# Patient Record
Sex: Female | Born: 1970
Health system: Southern US, Community
[De-identification: ages and names within clinical notes are randomized; demographics above are authoritative.]

## PROBLEM LIST (undated history)

## (undated) DIAGNOSIS — G4730 Sleep apnea, unspecified: Secondary | ICD-10-CM

## (undated) DIAGNOSIS — M549 Dorsalgia, unspecified: Secondary | ICD-10-CM

## (undated) DIAGNOSIS — R600 Localized edema: Secondary | ICD-10-CM

## (undated) DIAGNOSIS — R112 Nausea with vomiting, unspecified: Secondary | ICD-10-CM

## (undated) DIAGNOSIS — J45909 Unspecified asthma, uncomplicated: Secondary | ICD-10-CM

## (undated) DIAGNOSIS — J309 Allergic rhinitis, unspecified: Secondary | ICD-10-CM

## (undated) DIAGNOSIS — Z1379 Encounter for other screening for genetic and chromosomal anomalies: Secondary | ICD-10-CM

## (undated) DIAGNOSIS — K5900 Constipation, unspecified: Secondary | ICD-10-CM

## (undated) DIAGNOSIS — Z9889 Other specified postprocedural states: Secondary | ICD-10-CM

## (undated) DIAGNOSIS — Z87442 Personal history of urinary calculi: Secondary | ICD-10-CM

## (undated) DIAGNOSIS — Z5189 Encounter for other specified aftercare: Secondary | ICD-10-CM

## (undated) DIAGNOSIS — Z803 Family history of malignant neoplasm of breast: Secondary | ICD-10-CM

## (undated) DIAGNOSIS — I639 Cerebral infarction, unspecified: Secondary | ICD-10-CM

## (undated) DIAGNOSIS — D649 Anemia, unspecified: Secondary | ICD-10-CM

## (undated) DIAGNOSIS — I10 Essential (primary) hypertension: Secondary | ICD-10-CM

## (undated) DIAGNOSIS — C801 Malignant (primary) neoplasm, unspecified: Secondary | ICD-10-CM

## (undated) DIAGNOSIS — K219 Gastro-esophageal reflux disease without esophagitis: Secondary | ICD-10-CM

## (undated) DIAGNOSIS — N809 Endometriosis, unspecified: Secondary | ICD-10-CM

## (undated) DIAGNOSIS — N289 Disorder of kidney and ureter, unspecified: Secondary | ICD-10-CM

## (undated) DIAGNOSIS — Z85820 Personal history of malignant melanoma of skin: Secondary | ICD-10-CM

## (undated) DIAGNOSIS — N3010 Interstitial cystitis (chronic) without hematuria: Secondary | ICD-10-CM

## (undated) DIAGNOSIS — Z800 Family history of malignant neoplasm of digestive organs: Secondary | ICD-10-CM

## (undated) HISTORY — PX: COLON RESECTION: SHX5231

## (undated) HISTORY — DX: Family history of malignant neoplasm of digestive organs: Z80.0

## (undated) HISTORY — PX: MELANOMA EXCISION: SHX5266

## (undated) HISTORY — DX: Personal history of urinary calculi: Z87.442

## (undated) HISTORY — PX: ABDOMINAL HYSTERECTOMY: SHX81

## (undated) HISTORY — DX: Family history of malignant neoplasm of breast: Z80.3

## (undated) HISTORY — DX: Cerebral infarction, unspecified: I63.9

## (undated) HISTORY — DX: Constipation, unspecified: K59.00

## (undated) HISTORY — DX: Localized edema: R60.0

## (undated) HISTORY — PX: COLON SURGERY: SHX602

## (undated) HISTORY — DX: Dorsalgia, unspecified: M54.9

## (undated) HISTORY — DX: Anemia, unspecified: D64.9

## (undated) HISTORY — PX: APPENDECTOMY: SHX54

## (undated) HISTORY — DX: Disorder of kidney and ureter, unspecified: N28.9

## (undated) HISTORY — DX: Sleep apnea, unspecified: G47.30

## (undated) HISTORY — DX: Personal history of malignant melanoma of skin: Z85.820

## (undated) HISTORY — DX: Interstitial cystitis (chronic) without hematuria: N30.10

## (undated) HISTORY — DX: Unspecified asthma, uncomplicated: J45.909

## (undated) HISTORY — PX: PELVIC LAPAROSCOPY: SHX162

## (undated) HISTORY — DX: Encounter for other screening for genetic and chromosomal anomalies: Z13.79

## (undated) HISTORY — DX: Encounter for other specified aftercare: Z51.89

## (undated) HISTORY — PX: ARTHROSCOPIC REPAIR ACL: SUR80

---

## 1999-12-31 ENCOUNTER — Ambulatory Visit (HOSPITAL_COMMUNITY): Admission: RE | Admit: 1999-12-31 | Discharge: 1999-12-31 | Payer: Self-pay | Admitting: Obstetrics and Gynecology

## 1999-12-31 ENCOUNTER — Encounter: Payer: Self-pay | Admitting: Obstetrics and Gynecology

## 2000-02-27 ENCOUNTER — Encounter: Payer: Self-pay | Admitting: Obstetrics and Gynecology

## 2000-02-27 ENCOUNTER — Inpatient Hospital Stay (HOSPITAL_COMMUNITY): Admission: AD | Admit: 2000-02-27 | Discharge: 2000-02-27 | Payer: Self-pay | Admitting: Pediatrics

## 2000-06-04 ENCOUNTER — Encounter: Payer: Self-pay | Admitting: Obstetrics and Gynecology

## 2000-06-04 ENCOUNTER — Inpatient Hospital Stay (HOSPITAL_COMMUNITY): Admission: AD | Admit: 2000-06-04 | Discharge: 2000-06-04 | Payer: Self-pay | Admitting: Obstetrics and Gynecology

## 2000-06-06 ENCOUNTER — Inpatient Hospital Stay (HOSPITAL_COMMUNITY): Admission: AD | Admit: 2000-06-06 | Discharge: 2000-06-10 | Payer: Self-pay | Admitting: Obstetrics and Gynecology

## 2000-07-17 ENCOUNTER — Other Ambulatory Visit: Admission: RE | Admit: 2000-07-17 | Discharge: 2000-07-17 | Payer: Self-pay | Admitting: Obstetrics and Gynecology

## 2000-12-31 ENCOUNTER — Encounter: Payer: Self-pay | Admitting: Emergency Medicine

## 2000-12-31 ENCOUNTER — Emergency Department (HOSPITAL_COMMUNITY): Admission: EM | Admit: 2000-12-31 | Discharge: 2001-01-01 | Payer: Self-pay | Admitting: Emergency Medicine

## 2001-01-01 ENCOUNTER — Inpatient Hospital Stay (HOSPITAL_COMMUNITY): Admission: EM | Admit: 2001-01-01 | Discharge: 2001-01-04 | Payer: Self-pay | Admitting: Emergency Medicine

## 2001-01-03 ENCOUNTER — Encounter: Payer: Self-pay | Admitting: Urology

## 2001-08-09 ENCOUNTER — Other Ambulatory Visit: Admission: RE | Admit: 2001-08-09 | Discharge: 2001-08-09 | Payer: Self-pay | Admitting: Obstetrics and Gynecology

## 2002-03-03 ENCOUNTER — Encounter: Payer: Self-pay | Admitting: Urology

## 2002-03-03 ENCOUNTER — Ambulatory Visit (HOSPITAL_BASED_OUTPATIENT_CLINIC_OR_DEPARTMENT_OTHER): Admission: RE | Admit: 2002-03-03 | Discharge: 2002-03-03 | Payer: Self-pay | Admitting: Urology

## 2002-09-01 ENCOUNTER — Other Ambulatory Visit: Admission: RE | Admit: 2002-09-01 | Discharge: 2002-09-01 | Payer: Self-pay | Admitting: Obstetrics and Gynecology

## 2003-09-29 ENCOUNTER — Encounter: Admission: RE | Admit: 2003-09-29 | Discharge: 2003-09-29 | Payer: Self-pay | Admitting: Obstetrics and Gynecology

## 2004-02-09 ENCOUNTER — Encounter (INDEPENDENT_AMBULATORY_CARE_PROVIDER_SITE_OTHER): Payer: Self-pay | Admitting: Plastic Surgery

## 2004-02-09 ENCOUNTER — Ambulatory Visit (HOSPITAL_BASED_OUTPATIENT_CLINIC_OR_DEPARTMENT_OTHER): Admission: RE | Admit: 2004-02-09 | Discharge: 2004-02-09 | Payer: Self-pay | Admitting: Plastic Surgery

## 2004-02-09 ENCOUNTER — Ambulatory Visit (HOSPITAL_COMMUNITY): Admission: RE | Admit: 2004-02-09 | Discharge: 2004-02-09 | Payer: Self-pay | Admitting: Plastic Surgery

## 2004-07-17 ENCOUNTER — Ambulatory Visit: Payer: Self-pay | Admitting: Pulmonary Disease

## 2004-09-26 ENCOUNTER — Other Ambulatory Visit: Admission: RE | Admit: 2004-09-26 | Discharge: 2004-09-26 | Payer: Self-pay | Admitting: Obstetrics and Gynecology

## 2006-03-09 ENCOUNTER — Ambulatory Visit: Payer: Self-pay | Admitting: Critical Care Medicine

## 2006-10-16 ENCOUNTER — Ambulatory Visit: Payer: Self-pay | Admitting: Pulmonary Disease

## 2006-10-16 LAB — CONVERTED CEMR LAB
Basophils Relative: 0.8 % (ref 0.0–1.0)
Eosinophils Absolute: 0.3 10*3/uL (ref 0.0–0.6)
Eosinophils Relative: 3.7 % (ref 0.0–5.0)
Lymphocytes Relative: 30.9 % (ref 12.0–46.0)
MCV: 89.5 fL (ref 78.0–100.0)
Monocytes Relative: 5.3 % (ref 3.0–11.0)
Neutro Abs: 4.4 10*3/uL (ref 1.4–7.7)
Platelets: 400 10*3/uL (ref 150–400)
RBC: 4.4 M/uL (ref 3.87–5.11)
TSH: 0.92 microintl units/mL (ref 0.35–5.50)
WBC: 7.5 10*3/uL (ref 4.5–10.5)

## 2007-09-17 ENCOUNTER — Encounter: Payer: Self-pay | Admitting: Adult Health

## 2007-12-10 ENCOUNTER — Encounter: Admission: RE | Admit: 2007-12-10 | Discharge: 2007-12-10 | Payer: Self-pay | Admitting: Obstetrics and Gynecology

## 2008-03-23 ENCOUNTER — Telehealth (INDEPENDENT_AMBULATORY_CARE_PROVIDER_SITE_OTHER): Payer: Self-pay | Admitting: *Deleted

## 2008-10-05 ENCOUNTER — Encounter: Admission: RE | Admit: 2008-10-05 | Discharge: 2008-10-05 | Payer: Self-pay | Admitting: Otolaryngology

## 2008-12-08 ENCOUNTER — Encounter: Payer: Self-pay | Admitting: Pulmonary Disease

## 2008-12-13 ENCOUNTER — Encounter: Admission: RE | Admit: 2008-12-13 | Discharge: 2008-12-13 | Payer: Self-pay | Admitting: Family Medicine

## 2008-12-20 ENCOUNTER — Encounter: Admission: RE | Admit: 2008-12-20 | Discharge: 2008-12-20 | Payer: Self-pay | Admitting: Obstetrics and Gynecology

## 2008-12-26 ENCOUNTER — Encounter: Payer: Self-pay | Admitting: Pulmonary Disease

## 2009-01-29 ENCOUNTER — Encounter: Payer: Self-pay | Admitting: Pulmonary Disease

## 2009-04-10 ENCOUNTER — Encounter: Payer: Self-pay | Admitting: Pulmonary Disease

## 2010-06-11 ENCOUNTER — Encounter
Admission: RE | Admit: 2010-06-11 | Discharge: 2010-06-11 | Payer: Self-pay | Source: Home / Self Care | Attending: Family Medicine | Admitting: Family Medicine

## 2010-08-27 ENCOUNTER — Other Ambulatory Visit: Payer: Self-pay | Admitting: Dermatology

## 2010-09-23 ENCOUNTER — Other Ambulatory Visit: Payer: Self-pay | Admitting: Dermatology

## 2010-10-15 NOTE — Assessment & Plan Note (Signed)
[facility]                             PULMONARY OFFICE NOTE   Samantha Castro, Samantha Castro                       MRN:          [id number]  DATE:10/16/2006                            DOB:          Jan 07, 1971    HISTORY OF PRESENT ILLNESS:  The patient is a 40 year old white female  patient of Dr. 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  The patient presents today reporting that she is in her  general state of good health.  She denies any chest pain, shortness of  breath, abdominal pain, bloody stools, or leg swelling.  The patient  reports that she continues to be under quite a bit of stress.  Her  father has lung cancer and her mother probably has early stages of  dementia.  The patient also has a 29-year-old child and is working part-  time and going to school part-time.  The patient does follow up with Dr.  Jackelyn Knife for her gynecological care and reports no recent changes.  The  patient did have recent lab work through a life [employer] technologist.  Her  total cholesterol was 198, LDL was at 123.  Blood sugar was at 87.   PAST MEDICAL HISTORY:  1. Allergic rhinitis.  2. Pre-eclampsia.  3. Mild exercise induced asthma.  4. Skin melanoma followed by Dr. Yetta Barre in Dermatology.   CURRENT MEDICATIONS:  Correct and reviewed on the front of chart.   PHYSICAL EXAMINATION:  The patient is a very pleasant female in no acute  distress.  She is afebrile with stable vital signs.  Weight is at 156.  HEENT:  Unremarkable.  NECK:  Supple without cervical adenopathy.  No JVD.  LUNGS:  Sounds are clear to auscultation bilaterally.  CARDIAC:  S1-S2 without murmur, rub, or gallop.  ABDOMEN:  Soft and nontender, no palpable or hepatosplenomegaly.  EXTREMITIES:  Warm without any calf tenderness, cyanosis, clubbing, or  edema.  SKIN:  Warm without any rash.   IMPRESSION/PLAN:  1. Allergic rhinitis, well compensated on Zyrtec.  Patient has      requested change from Nasacort to [facility] due to formulary      coverage.  She is asked to use Flonase 1-2 puffs twice daily.  2. Health maintenance activities.  The patient is up to date for her      tetanus.  She is to continue to follow up with Dr. Jackelyn Knife for      her gynecological care.  Is recommended on monthly self breast      exam.  The patient is advised on exercise and dietary measures as      well and use of sunscreen.  3. Nursing school physical form.  Was completed and immunization data      was updated.  A PPD test was placed today for nursing school      requirements.  We will follow up accordingly.      Rubye Oaks, NP  Electronically Signed  Samantha Castro. Samantha Basque, MD  Electronically Signed   TP/MedQ  DD: 10/16/2006  DT: 10/16/2006  Job #: [id number]

## 2010-10-18 NOTE — Discharge Summary (Signed)
[facility]  Patient:    Samantha Castro, Samantha Castro Visit Number: [id number] MRN: [id number]          Service Type: MED Location: [facility] Left 01 Attending Physician:  Evlyn Clines Dictated by:   Barron Alvine, M.D. Adm. Date:  01/01/2001 Disc. Date: 01/04/2001                             Discharge Summary  DISCHARGE DIAGNOSES: 1. Ureteral calculus. 2. Hydronephrosis. 3. Acute pyelonephritis/urosepsis.  PROCEDURE:  Cystoscopy, retrograde pyelogram, ureteroscopy, and double J stent placement on 01/01/01.  HOSPITAL COURSE:  Samantha Castro is a patient of Dr. Kerin Ransom.  She had presented with left flank pain, severe nausea and vomiting, and urgency. Under evaluation, they found that the patient had evidence of a distal ureteral calculus with obstruction of the ureter.  Dr. Annabell Howells had planned on placing a double J stent because of what appeared to be obstruction along with concurrent infection.  Because the case got delayed and I was on call, he asked me if I would intervene on behalf of Samantha Castro to try to relieve her obstruction and stone.  The patient was admitted and noted to have a significantly elevated white blood cell count.  At surgery, she was found to have a small distal ureteral calculus which was easily extracted without significant manipulation.  A double J stent was placed.  The patients postoperative white blood cell count was 29,000.  She continued to have flank pain and fever and chills for several days after her system was decompressed. Her urine culture did eventually grow E. coli which was pansensitive.  Her clinical situation slowly improved.  Dr. Annabell Howells took over her care, and discharged her home on postoperative day #3.  She was afebrile at the time of discharge, and feeling significantly better.  She was sent home on p.o. Bactrim.  DISPOSITION:  The patient will be seen by Dr. Annabell Howells in followup. Dictated by:   Barron Alvine, M.D. Attending  Physician:  Evlyn Clines DD:  01/27/01 TD:  01/27/01 Job: [id number]

## 2010-10-18 NOTE — Assessment & Plan Note (Signed)
[facility]                               PULMONARY OFFICE NOTE   KEALA, DRUM                       MRN:          [id number]  DATE:03/09/2006                            DOB:          19-Sep-1970   HISTORY OF PRESENT ILLNESS:  This is a very pleasant 40 year old white  female patient of Dr. Jodelle Green who has a known history of allergic rhinitis  who presents for routine follow up.  The patient is maintained on Zyrtec D  and Nasacort AQ nasal spray.  The patient reports she has been doing  exceptionally well without any significant complaints.  She denies any chest  pain, shortness of breath, wheezing, nocturnal symptoms, post nasal drip, or  congestion.  The patient does request a refill of her Xanax.  She reports  that she has had some slight stress recently with her father who has been  recently diagnosed with lung cancer.  She has had a previous prescription  for Xanax but only uses it on a rare occasion and her prescription is  expired.   PAST MEDICAL HISTORY:  Allergic rhinitis. Pre-eclampsia.  Mild exercise  induced asthma.   CURRENT MEDICATIONS:  Zyrtec D b.i.d., hydrochlorothiazide 25 mg daily,  Nasacort AQ 2 puffs daily, Xanax 0.25 mg, 1/2 to 1 three times a day as  needed.   ALLERGIES:  No known drug allergies.   PHYSICAL EXAMINATION:  GENERAL:  The patient is a pleasant female in no acute distress.  VITAL SIGNS:  She is afebrile with stable vital signs.  HEENT:  Unremarkable.  Nasal mucosa is pink and moist.  Posterior pharynx is  clear.  Tympanic membranes are normal.  NECK:  Supple without adenopathy.  LUNGS:  Sounds are clear.  CARDIAC:  Regular rate.  ABDOMEN:  Soft and benign.  EXTREMITIES:  Warm without any edema.   IMPRESSION AND PLAN:  1. Allergic rhinitis is well compensated with Zyrtec and Nasacort.  The      patient is to continue with her current regimen.  2. Mild anxiety.  The patient's prescription was refilled  on Xanax 0.25      mg, 1/2 to 1 up to three times a day as needed, #30 was given without      any refills.  3. The patient will follow up back up with Dr. Kriste Basque for her complete      physical exam or sooner if needed.    ______________________________  Rubye Oaks, NP    ______________________________  Lonzo Cloud. Kriste Basque, MD   TP/MedQ DD:  03/11/2006 DT:  03/12/2006 Job #:  [id number]

## 2010-10-18 NOTE — Op Note (Signed)
NAME:  Samantha Castro, Samantha Castro                          ACCOUNT NO.:  [id number]   MEDICAL RECORD NO.:  [id number]                   PATIENT TYPE:  AMB   LOCATION:  DSC                                  FACILITY:  [facility]   PHYSICIAN:  Alfredia Ferguson, M.D.               DATE OF BIRTH:  Oct 04, 1970   DATE OF PROCEDURE:  02/09/2004  DATE OF DISCHARGE:                                 OPERATIVE REPORT   PREOPERATIVE DIAGNOSIS:  1.  A 9 mm pigmented nevus, left midcalf.  2.  An 8 mm pigmented nevus, right breast contained within the areola.   POSTOPERATIVE DIAGNOSIS:  1.  A 9 mm pigmented nevus, left midcalf.  2.  An 8 mm pigmented nevus, right breast contained within the areola.   OPERATION PERFORMED:  Elliptical excision of pigmented nevi times two.   SURGEON:  Alfredia Ferguson, M.D.   ANESTHESIA:  2% Xylocaine with 1:100,000 epinephrine.   INDICATIONS FOR PROCEDURE:  A 40 year old woman with multivarigated  pigmented nevis which are in the 8 to 9 mm size range.  These have been  changing of late in terms of darkening of the color and enlarging.  The  patient and Dr. Swaziland wish to have these areas removed.  The patient  understands she will be trading what she has for permanent and potentially  unsightly scar.  In spite of that, the patient wishes to proceed with the  surgery.   DESCRIPTION OF PROCEDURE:  Skin markers were placed around each of the two  lesions in elliptical fashion with approximately 2 mm margins.  Local  anesthesia was infiltrated in both areas.  The patient was first placed in a  prone position.  Her left calf was prepped with Betadine and draped with  sterile drapes.  An elliptical excision of the lesion down to the level of  the subcutaneous tissue was carried out.  Specimen was passed off for  pathology.  The wound edges were undermined for a distance of several  millimeters in all directions.  Hemostasis was accomplished using pressure.  The wound was closed by  approximating the dermis with interrupted 4-0  Monocryl suture.  The skin was united with a running 4-0 nylon.  The patient  tolerated the procedure well with minimal blood loss.  The area was  cleansed, dried and a circumferential dressing was placed on the calf.  The  patient was returned to the supine position where her right breast was  prepped with Betadine and draped in sterile drapes.  A radial excision in  the areola was carried out excising the pigmented nevus.  Within the area of  excision was a second pigmented nevus just adjacent to the first one.  I  incorporated both nevi in the excision.  The excision was down to the  subcutaneous and some of the breast tissue.  The specimen was passed off for  pathology.  Wound edges were undermined for a  distance of several millimeters in all directions.  The wound was closed by  approximating the dermis with interrupted 4-0 Monocryl suture.  The skin was  closed with a running 6-0 nylon suture.  The patient tolerated the procedure  well.  A  light dressing was applied and the patient was discharged to home  in satisfactory condition.                                               Alfredia Ferguson, M.D.    WBB/MEDQ  D:  02/09/2004  T:  02/09/2004  Job:  [id number]   cc:   Amy Y. Swaziland, M.D.  [address]  [place]  [place] [address]  Fax: [phone]

## 2010-10-18 NOTE — Discharge Summary (Signed)
[facility]  Patient:    Samantha Castro, Samantha Castro                       MRN: [id number] Adm. Date:  [id number] Disc. Date: [id number] Attending:  Malon Kindle                           Discharge Summary  HISTORY OF PRESENT ILLNESS:   [age]-year-old white married female para 0, gravida 1, last period August 26, 1999, [facility] June 02, 2000 by dates and June 06, 2000 by ultrasound admitted with contractions, bloody show, and an elevated blood pressure.  Blood group and type O positive with negative antibody, nonreactive serology, rubella immune, hepatitis B surface antigen negative, GC and Chlamydia negative, HIV negative, triple screen normal, one-hour glucola 118, group B strep negative.  Vaginal ultrasound on Oct 31, 1999 crown-rump length 2.2 cm, 9 weeks 0 days, [facility] June 06, 2000.  Repeat ultrasound December 31, 1999 average gestational age [redacted] weeks 6 days, [facility] June 03, 2000.  The patient had second trimester bleeding.  Ultrasound on February 27, 2000 average gestational age [redacted] weeks 0 days, [facility] May 28, 2000.  Fetal fibronectin was negative. Prenatal course has been relatively unremarkable except the patient has had somewhat more visits than the average patient.  The patient called early on the morning of admission complaining of bloody show, painful contractions, and decreased fetal movement.  The cervix had been 3+ cm in the office on June 04, 2000.  She has had no headache or epigastric pain.  PAST MEDICAL HISTORY:         No known allergies.  Operations: None. Illnesses: Exercise-induced asthma, gastroesophageal reflux disorder.  FAMILY HISTORY:               Father with high blood pressure.  Mother had a CVA secondary to carotid stenosis.  SOCIAL HISTORY:               Alcohol, tobacco, and drugs none.  PHYSICAL EXAMINATION:         On admission, the patients blood pressure was 146/102 and 161/105, temperature was normal, pulse  was 105, respirations 18. The abdomen was soft.  There was term-sized fundus.  Fetal heart tones were normal.  Cervix was 3+ cm, 50%, vertex, and at -3.  Deep tendon reflexes were 2+.  IMPRESSIONS:                  1. Intrauterine pregnancy at term.                               2. Pregnancy-induced high blood pressure.  HOSPITAL COURSE:              PIH labs were obtained.  Magnesium sulfate was begun at 4 g bolus and then 2 g/hr.  By 9:18 a.m., patient had no contractions.  Her labs were okay.  Pitocin was begun at 9 a.m.  The cervix was 3+ cm, 50%, vertex, and at -3.  At that time, I felt the vertex was too high for an amniotomy.  By 12:15 p.m., the contractions were every two to three minutes, cervix 3+ cm, 70%, vertex, -2.  Artificial rupture of the membranes produced clear fluid.  Blood pressure at that time was mostly in the 80s diastolic.  Pitocin was at 12 mU/min.  By 6:53 p.m., the contractions were regular for the most part, cervix was 7 cm, 90%, vertex, and at -2.  By 9:45 p.m., the contractions were regular on 22 mU/min of Pitocin, cervix was 8-9 cm, 100%, vertex, and at -1.  There was blood in the urine.  At that point, the patient had a catheter because of her epidural.  The patient made slow but steady progress to full dilatation.  She pushed well and delivered spontaneously LOA over an intact perineum but a left vaginal and labial laceration a living female infant 7 pounds 7 ounces, Apgars were 6 at one and 8 at five minutes.  At five minutes, one was off for color and one was off for tone, this was thought to be secondary to the magnesium sulfate.  Dr. Ambrose Mantle was in attendance.  Placenta was intact.  Uterus normal.  Laceration repaired with 3-0 Dexon.  Blood loss about 500 cc.  The patient had hematuria for at least seven hours before delivery so I left the Foley catheter in.  On the day of delivery at 4:22 p.m., the blood pressure was okay, output was good, hematuria had  cleared, magnesium level on 2 g/hr was 6.8 so I decreased the magnesium sulfate to 1.5 g/hr and the patient felt better.  She had less dizziness and lethargy.  Her SGOT was 45, uric acid was 6.7.                                On the first postpartum day, her blood pressure was acceptable, the output was good, labs were acceptable.  SGOT had gone from 24 to 45 to 52.  Uric acid from 5.2 to 6.7 to 7.4.  Reflexes were 2+.  By 5 p.m. on the first day after delivery, the patients blood pressures were good, her output had increased, deep tendon reflexes were 2/4, magnesium sulfate was discontinued.  On the second postpartum day, the patient was very lethargic and quite ambivalent about discharge, so her labs were somewhat better except the uric acid had continued to increase, felt we should observe her for another day.  On June 10, 2000, on the third postpartum day, she was feeling much better, her blood pressures were acceptable, and she was discharged.  PERTINENT LABORATORY DATA:    Urinalysis on admission showed negative for protein.  Comprehensive metabolic profile on admission for [facility] panel revealed a glucose of 116, alkaline phosphatase of 137, albumin of 3.1.  The liver function tests: SGOT 24, PT 3, bilirubin 0.3, BUN was 9, creatinine 0.6.  LDH was 149 and uric acid was 5.2.  Platelet count was normal with a hemoglobin of 12.2, hematocrit 33.6, white count 9300, and platelet count 283,000. Magnesium sulfate level as reported earlier at 0952 hours on June 07, 2000 was 6.8.  A followup white count on the first postpartum day was 23,600, hemoglobin 12.3, hematocrit 35.8, platelet count 294,000.  On that day, her LDH was 273, uric acid was 6.7, SGOT was 45, SGPT was 3, creatinine 0.8.  All other values were normal.  RPR was nonreactive.  Followup hemoglobins were 10.3, 9.8, and 9.0; hematocrits 29.4, 28.3, and 25.6; platelet counts all normal.  A final PIH evaluation showed a uric  acid of 7.7, a SGPT of 7, SGOT of 40.  FINAL DIAGNOSES:              1. Intrauterine pregnancy at 40 weeks,  delivered                                  left occiput anterior.                               2. Preeclampsia.   OPERATIONS:                   Spontaneous delivery LOA, repair of left vaginal and labial lacerations.  FINAL CONDITION:              Improved.  INSTRUCTIONS:                 Regular discharge instruction booklet.  Patient is given a prescription for Tylenol No. 3 (12 tablets) one every 4-6 hours as needed for pain and asked to return to the office in six weeks for followup examination. DD:  06/10/00 TD:  06/10/00 Job: [id number]

## 2010-10-18 NOTE — H&P (Signed)
[facility]  Patient:    Samantha, Castro                      MRN: [id number] Adm. Date:  01/01/01 Attending:  Excell Seltzer. Annabell Howells, M.D.                         History and Physical  CHIEF COMPLAINT:  Left flank pain.  HISTORY OF PRESENT ILLNESS:  Samantha Castro is a 40 year old white female who had the onset Thursday of left flank pain, left lower quadrant pain.  It was severe with nausea and vomiting, urgency, and hematuria.  She has had chills but no documented fevers.  She has had no other symptoms.  She has had a history of left renal stones.  She was seen in the emergency room, where a CT scan demonstrated left renal calculi which had been present since 1998.  There was also a questionable 7 mm calcification in the left distal ureter.  There was obstruction of the ureter.  ALLERGIES:  No medication allergy.  CURRENT MEDICATIONS: 1. Prenatal vitamins. 2. She has Tylox, ibuprofen, and Macrobid from the ER but has not been able to    take it due to the nausea and vomiting.  PAST MEDICAL HISTORY:  Asthma and the kidney stones.  PAST SURGICAL HISTORY:  Unremarkable.  SOCIAL HISTORY:  Negative for tobacco or alcohol.  FAMILY HISTORY:  Unremarkable.  REVIEW OF SYSTEMS:  She has had the fever, chills, some dizziness, weakness. She has been hot, tired, has the abdominal and flank pain and nausea and vomiting, and some irritative voiding symptoms as noted.  She is otherwise without complaints per our checklist except as above.  PHYSICAL EXAMINATION:  VITAL SIGNS:  Blood pressure 109/66, heart rate 93, temperature 101.2.  GENERAL:  She is a well-developed, well-nourished white female in no acute distress.  Alert and oriented x 3.  HEENT:  Normocephalic, atraumatic.  NECK:  Supple.  LUNGS:  Clear.  HEART:  Regular rate and rhythm.  ABDOMEN:  Soft, flat, with left CVA tenderness.  No mass, hepatosplenomegaly, or hernias are noted.  She has no  inguinal adenopathy.  GENITOURINARY:  Exam was not done today.  It will be done in the OR.  EXTREMITIES:  Full range of motion without edema.  NEUROLOGIC:  She is grossly intact.  SKIN:  Warm and dry.  LABORATORY DATA:  Her urine today is 25-30 white cells, 8-10 red cells, 25-30 epithelial cells, 3+ bacteria, 3+ mucus, and was nitrite positive.  A KUB was obtained which revealed a cluster of stones in the left kidney, which had been present since 1998.  There is also a small 4-5 mm lower pole stone.  There were multiple calcifications in the pelvis, consistent with phleboliths.  These have grown significantly since 1998.  There is only one calcification noted that was not previously present.  That is about a 3 mm stone in the area of what would be the distal ureter.  It was felt that an IVP was indicated to clarify which of these calcifications was a stone.  This was done with Isovue.  Contrast was instilled.  She has prompt nephrograms.  Renal contours were smooth and symmetrical.  She had prompt excretion on the right at five minutes with a delicate internal collecting system and normal ureter. The bladder began filling by then and there appeared to be edema of the left trigone with  a small calcification felt to be in the distal ureter overlying this area.  She had delayed films out to one hour.  She had a dense nephrogram on the left but never got any contrast into the collecting system.  IMPRESSION:  Probable left distal ureteral stone with high-grade obstruction and febrile urinary tract infection.  PLAN:  The plan at this time is to bring her in for antibiotics and cystoscopy with stent insertion.  The risks of the procedure were explained to the patient.  Dr. Isabel Caprice will be performing the procedure if it is delayed due to add-ons. DD:  01/01/01 TD:  01/02/01 Job: [id number]

## 2010-10-18 NOTE — Op Note (Signed)
   NAME:  Samantha Castro, Samantha Castro                          ACCOUNT NO.:  [id number]   MEDICAL RECORD NO.:  [id number]                   PATIENT TYPE:  AMB   LOCATION:  NESC                                 FACILITY:  [facility]   PHYSICIAN:  Excell Seltzer. Annabell Howells, M.D.                 DATE OF BIRTH:  June 08, 1970   DATE OF PROCEDURE:  03/03/2002  DATE OF DISCHARGE:                                 OPERATIVE REPORT   PREOPERATIVE DIAGNOSIS:  Left distal ureteral stones.   POSTOPERATIVE DIAGNOSIS:  Left distal ureteral stones.   PROCEDURE:  Left ureteroscopic stone extraction.   SURGEON:  Excell Seltzer. Annabell Howells, M.D.   ANESTHESIA:  General.   SPECIMENS:  Stones.   COMPLICATIONS:  None.   INDICATIONS:  The patient is a 40 year old white female with a history of  urolithiasis who presented with recent pain and was found to have two large  left distal ureteral stones.  She previously had a cluster of stones in the  left lower pole, which was now missing.  She has elected ureteroscopic stone  extraction.   DESCRIPTION OF PROCEDURE:  The patient is given p.o. Tequin.  She was taken  to the operating room.  A general anesthetic was induced.  She was placed in  lithotomy position.  Her perineum and genitalia were prepped with Betadine  solution, and she was draped in the usual sterile fashion.  The 6 French  short ureteroscope was passed without difficulty into the bladder.  Quick  inspection of the bladder revealed no abnormalities.  The left ureteral  orifice was cannulated with the scope without difficulty and passed to the  level of the stones.  The scope was advanced more proximally than the stones  in order to dilate the ureteral meatus.  A nitinol stone basket was then  used to grasp the distalmost stone.  This was removed without difficulty.  I  then reinserted the scope and removed the second stone, also without  difficulty.  Reinspection of the ureter following extraction revealed  minimal ureteral trauma or  inflammation.  The bladder was then drained with  the cystoscope sheath.  The patient was taken down from the lithotomy  position.  Her anesthetic was reversed.  She was removed to the recovery  room in stable condition.  There were no complications during the procedure.                                               Excell Seltzer. Annabell Howells, M.D.    JJW/MEDQ  D:  03/03/2002  T:  03/03/2002  Job:  [id number]   cc:   Lonzo Cloud. Kriste Basque, M.D. [facility]

## 2010-10-18 NOTE — Op Note (Signed)
[facility]  Patient:    Samantha Castro, Samantha Castro                       MRN: [id number] Proc. Date: 01/01/01 Adm. Date:  [id number] Attending:  Thermon Leyland CC:         Excell Seltzer. Annabell Howells, M.D.   Operative Report  PREOPERATIVE DIAGNOSES: 1. Left flank pain. 2. Left hydronephrosis. 3. Left ureteral calculus. 4. Possible urosepsis.  POSTOPERATIVE DIAGNOSES: 1. Left flank pain. 2. Left hydronephrosis. 3. Left ureteral calculus. 4. Possible urosepsis.  PROCEDURE:  Cystoscopy, left retrograde pyelography, ureteroscopy, stone basketing, and double J stent placement.  SURGEON:  Barron Alvine, M.D.  ANESTHESIA:  General.  INDICATIONS FOR PROCEDURE:  Samantha Castro is a 40 year old female. She has previously been under the care of Dr. Vonita Moss. She saw Dr. Annabell Howells today with fever and chills. A CT scan had suggested hydronephrosis which was thought to be secondary to a 7 mm distal left ureteral stone. Dr. Annabell Howells administered IV contrast in the office today and found that the patient had severe high grade obstruction. He felt that the probable calculous was actually smaller and more distal. Because of his concerns over fever and chills and the possibility of urosepsis with and obstructed system, he felt that she did need treatment this evening. He had planned on performing this but because of OR delays, it was going to be much later in the evening and asked that the on-call physician who was myself to consider taking on this case. I discussed this along with the rational with the patient and her husband. They seem to understand the rational for decompression of the kidney. We felt that if it were easy that we would consider attempting to extract the stone so she would not have to have potentially a secondary procedure.  TECHNIQUE AND FINDINGS:  The patient was brought to the operating room after receiving intravenous ciprofloxacin prior to induction. She was then  placed in lithotomy position, prepped and draped in the usual manner. On cystoscopy, she did have some inflammation of the left hemitrigone and intramural ureter. The ureteral orifice itself was markedly stenotic. We attempted retrogrades with a cone tip catheter but found that very difficult to really access the ureter because of some of the edema. We used an open end stent along with a Glidewire and were able to access the ureter. I was able to put the Glidewire up to the renal pelvis and then through the open end catheter confirm that there was significant narrowing and a filling defect in the distal ureter with some mild dilation of the ureter and some retained contrast in her kidney from her IVP. We then used a 6.5 French ureteroscope to gently view the distal left ureter. There was considerable intramural edema and inflammation. A 3 mm stone was found partially imbedded in the wall but we were able to free it up with the ureteroscope. Once it was sitting in the more proximal aspect of the ureter, we were able to easily basket extract this stone. We then placed a 24 cm 7 [place] double J stent over the Glidewire without difficulty. The patient appeared to tolerate the procedure well. The stent was left with a dangle string attached to the patients inner thigh. DD:  01/01/01 TD:  01/04/01 Job: [id number]

## 2011-05-15 ENCOUNTER — Other Ambulatory Visit: Payer: Self-pay | Admitting: Family Medicine

## 2011-05-15 DIAGNOSIS — N63 Unspecified lump in unspecified breast: Secondary | ICD-10-CM

## 2011-05-15 DIAGNOSIS — Z1231 Encounter for screening mammogram for malignant neoplasm of breast: Secondary | ICD-10-CM

## 2011-05-21 ENCOUNTER — Other Ambulatory Visit: Payer: Self-pay | Admitting: Family Medicine

## 2011-05-21 DIAGNOSIS — N63 Unspecified lump in unspecified breast: Secondary | ICD-10-CM

## 2011-06-04 ENCOUNTER — Ambulatory Visit
Admission: RE | Admit: 2011-06-04 | Discharge: 2011-06-04 | Disposition: A | Discharge status: Home / Self Care | Payer: 59 | Source: Ambulatory Visit | Attending: Family Medicine | Admitting: Family Medicine

## 2011-06-04 ENCOUNTER — Other Ambulatory Visit: Payer: Self-pay | Admitting: Family Medicine

## 2011-06-04 DIAGNOSIS — N63 Unspecified lump in unspecified breast: Secondary | ICD-10-CM

## 2011-06-16 ENCOUNTER — Ambulatory Visit: Payer: Self-pay

## 2011-06-17 ENCOUNTER — Other Ambulatory Visit: Payer: Self-pay | Admitting: Dermatology

## 2011-07-02 ENCOUNTER — Other Ambulatory Visit: Payer: Self-pay | Admitting: Dermatology

## 2012-05-17 ENCOUNTER — Other Ambulatory Visit: Payer: Self-pay | Admitting: Family Medicine

## 2012-05-17 DIAGNOSIS — Z1231 Encounter for screening mammogram for malignant neoplasm of breast: Secondary | ICD-10-CM

## 2012-05-18 ENCOUNTER — Other Ambulatory Visit: Payer: Self-pay | Admitting: Dermatology

## 2012-06-23 ENCOUNTER — Other Ambulatory Visit: Payer: Self-pay | Admitting: Dermatology

## 2012-06-23 ENCOUNTER — Ambulatory Visit
Admission: RE | Admit: 2012-06-23 | Discharge: 2012-06-23 | Disposition: A | Discharge status: Home / Self Care | Payer: 59 | Source: Ambulatory Visit | Attending: Family Medicine | Admitting: Family Medicine

## 2012-06-23 DIAGNOSIS — Z1231 Encounter for screening mammogram for malignant neoplasm of breast: Secondary | ICD-10-CM

## 2013-01-26 ENCOUNTER — Other Ambulatory Visit: Payer: Self-pay | Admitting: Dermatology

## 2013-02-15 ENCOUNTER — Other Ambulatory Visit: Payer: Self-pay | Admitting: Dermatology

## 2013-06-01 ENCOUNTER — Other Ambulatory Visit: Payer: Self-pay

## 2013-06-01 DIAGNOSIS — Z1231 Encounter for screening mammogram for malignant neoplasm of breast: Secondary | ICD-10-CM

## 2013-06-27 ENCOUNTER — Ambulatory Visit: Payer: 59

## 2013-07-18 ENCOUNTER — Ambulatory Visit: Payer: 59

## 2013-08-02 ENCOUNTER — Other Ambulatory Visit: Payer: Self-pay | Admitting: Dermatology

## 2013-08-03 ENCOUNTER — Ambulatory Visit
Admission: RE | Admit: 2013-08-03 | Discharge: 2013-08-03 | Disposition: A | Discharge status: Home / Self Care | Payer: PRIVATE HEALTH INSURANCE | Source: Ambulatory Visit

## 2013-08-03 DIAGNOSIS — Z1231 Encounter for screening mammogram for malignant neoplasm of breast: Secondary | ICD-10-CM

## 2014-02-08 ENCOUNTER — Other Ambulatory Visit: Payer: Self-pay | Admitting: Dermatology

## 2014-03-02 ENCOUNTER — Other Ambulatory Visit: Payer: Self-pay | Admitting: Dermatology

## 2014-07-12 ENCOUNTER — Other Ambulatory Visit: Payer: Self-pay

## 2014-07-12 DIAGNOSIS — Z1231 Encounter for screening mammogram for malignant neoplasm of breast: Secondary | ICD-10-CM

## 2014-08-23 ENCOUNTER — Ambulatory Visit: Payer: PRIVATE HEALTH INSURANCE

## 2014-08-29 ENCOUNTER — Ambulatory Visit: Payer: PRIVATE HEALTH INSURANCE

## 2014-09-29 ENCOUNTER — Ambulatory Visit: Payer: PRIVATE HEALTH INSURANCE

## 2016-10-21 ENCOUNTER — Other Ambulatory Visit: Payer: Self-pay | Admitting: Family Medicine

## 2016-10-21 DIAGNOSIS — Z1231 Encounter for screening mammogram for malignant neoplasm of breast: Secondary | ICD-10-CM

## 2016-10-23 ENCOUNTER — Ambulatory Visit: Payer: Self-pay

## 2016-10-28 ENCOUNTER — Ambulatory Visit: Payer: Self-pay | Admitting: Surgery

## 2016-10-28 DIAGNOSIS — C439 Malignant melanoma of skin, unspecified: Secondary | ICD-10-CM

## 2016-10-28 NOTE — H&P (Signed)
Samantha Castro 10/28/2016 1:27 PM Location: [facility] Patient #: [id number] DOB: 1971-04-30 Married / Language: English / Race: Refused to Report/Unreported Female  History of Present Illness Marcello Moores A. Gabryelle Whitmoyer MD; 10/28/2016 2:32 PM) Patient words: melanoma     The patient sent at the request of Dr. Danella Sensing for a right wrist melanoma. She was seen by her dermatologist she had a spot on the dorsum of her right wrist that look suspicious. Biopsy was done which showed a 9 mm nevoid melanoma dorsum right wrist. There is no ulceration. There is no evidence of regression. The deep shave margin was clear. She has a history of multiple skin lesions that have been removed by her dermatologist in the past. 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 She has no other complaints today.  The patient is a 46 year old female.   Past Surgical History Sharyn Lull R. Brooks, CMA; 10/28/2016 1:37 PM) Appendectomy Hysterectomy (not due to cancer) - Complete Knee Surgery Left. Resection of Small Bowel  Diagnostic Studies History Sharyn Lull R. Rolena Infante, CMA; 10/28/2016 1:37 PM) Colonoscopy 1-5 years ago Mammogram within last year Pap Smear 1-5 years ago  Allergies Sharyn Lull R. Brooks, CMA; 10/28/2016 1:38 PM) No Known Drug Allergies 10/28/2016  Medication History Sharyn Lull R. Brooks, CMA; 10/28/2016 1:38 PM) Protonix (Oral) Specific strength unknown - Active. Losartan Potassium (Oral) Specific strength unknown - Active. Medications Reconciled  Social History Sharyn Lull R. Brooks, CMA; 10/28/2016 1:38 PM) Caffeine use Carbonated beverages. No alcohol use Tobacco use Never smoker.  Family History Sharyn Lull R. Rolena Infante, CMA; 10/28/2016 1:37 PM) Anesthetic complications Mother. Cancer Mother. Cerebrovascular Accident Father, Mother. Colon Cancer Mother. Colon Polyps Mother. Depression Brother. Rectal Cancer Father.  Pregnancy / Birth History Sharyn Lull R. Rolena Infante, CMA;  10/28/2016 1:38 PM) Age at menarche 61 years. Contraceptive History Oral contraceptives. Gravida 1 Length (months) of breastfeeding 7-12 Maternal age 35-30 Para 1  Other Problems Sharyn Lull R. Brooks, CMA; 10/28/2016 1:38 PM) Back Pain Gastroesophageal Reflux Disease General anesthesia - complications High blood pressure Kidney Stone Melanoma Transfusion history     Review of Systems [facility] R. Brooks CMA; 10/28/2016 1:38 PM) General Not Present- Appetite Loss, Chills, Fatigue, Fever, Night Sweats, Weight Gain and Weight Loss. Skin Present- Change in Wart/Mole. Not Present- Dryness, Hives, Jaundice, New Lesions, Non-Healing Wounds, Rash and Ulcer. HEENT Present- Seasonal Allergies. Not Present- Earache, Hearing Loss, Hoarseness, Nose Bleed, Oral Ulcers, Ringing in the Ears, Sinus Pain, Sore Throat, Visual Disturbances, Wears glasses/contact lenses and Yellow Eyes. Gastrointestinal Present- Constipation. Not Present- Abdominal Pain, Bloating, Bloody Stool, Change in Bowel Habits, Chronic diarrhea, Difficulty Swallowing, Excessive gas, Gets full quickly at meals, Hemorrhoids, Indigestion, Nausea, Rectal Pain and Vomiting.  Vitals [employer] R. Brooks CMA; 10/28/2016 1:37 PM) 10/28/2016 1:37 PM Weight: 201.5 lb Height: 64in Body Surface Area: 1.96 m Body Mass Index: 34.59 kg/m  BP: 150/92 (Sitting, Left Arm, Standard)      Physical Exam (Godson Pollan A. Jacion Dismore MD; 10/28/2016 2:31 PM)  General Mental Status-Alert. General Appearance-Consistent with stated age. Hydration-Well hydrated. Voice-Normal.  Integumentary Note: Dorsum of right wrist scar from the previous shave site. This measures approximately 8 mm.  Eye Eyeball - Bilateral-Extraocular movements intact. Sclera/Conjunctiva - Bilateral-No scleral icterus.  Chest and Lung Exam Chest and lung exam reveals -quiet, even and easy respiratory effort with no use of accessory muscles and on  auscultation, normal breath sounds, no adventitious sounds and normal vocal resonance. Inspection Chest Wall - Normal. Back - normal.  Cardiovascular Cardiovascular examination reveals -normal heart  sounds, regular rate and rhythm with no murmurs and normal pedal pulses bilaterally.  Neurologic Neurologic evaluation reveals -alert and oriented x 3 with no impairment of recent or remote memory. Mental Status-Normal.  Lymphatic Head & Neck  General Head & Neck Lymphatics: Bilateral - Description - Normal. Axillary  General Axillary Region: Bilateral - Description - Normal. Tenderness - Non Tender.    Assessment & Plan (Darthula Desa A. 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 She has a duskiness area to make this possible. Discussed sentinel lymph node mapping and the pros and cons of doing this. Current national recommendations are for sentinel lymph node mapping for any melanomas greater than 8 mm in depth. I discussed management of days or if she does have positive lymph node which for now is Risk of sentinel lymph node mapping include bleeding, infection, lymphedema, shoulder pain. stiffness, dye allergy. cosmetic deformity , blood clots, death, need for more surgery. Pt agres to proceed. observation.  Current Plans Pt Education - Melanoma: cancer You are being scheduled for surgery- Our schedulers will call you.  You should hear from our office's scheduling department within 5 working days about the location, date, and time of surgery. We try to make accommodations for patient's preferences in scheduling surgery, but sometimes the OR schedule or the surgeon's schedule prevents [place] from making those accommodations.  If you have not heard from our office [phone]) in 5 working days, call the office and ask for your surgeon's nurse.  If you have other questions about your diagnosis, plan, or  surgery, call the office and ask for your surgeon's nurse.  The pathophysiology of skin & subcutaneous masses was discussed. Natural history risks without surgery were discussed. I recommended surgery to remove the mass. I explained the technique of removal with use of local anesthesia & possible need for more aggressive sedation/anesthesia for patient comfort.  Risks such as bleeding, infection, wound breakdown, heart attack, death, and other risks were discussed. I noted a good likelihood this will help address the problem. Possibility that this will not correct all symptoms was explained. Possibility of regrowth/recurrence of the mass was discussed. We will work to minimize complications. Questions were answered. The patient expresses understanding & wishes to proceed with surgery.  MELANOMA OF WRIST (C43.60)

## 2016-11-10 ENCOUNTER — Ambulatory Visit: Payer: Self-pay

## 2016-11-11 ENCOUNTER — Encounter (HOSPITAL_BASED_OUTPATIENT_CLINIC_OR_DEPARTMENT_OTHER): Payer: Self-pay | Admitting: *Deleted

## 2016-11-17 ENCOUNTER — Encounter (HOSPITAL_COMMUNITY): Payer: 59

## 2016-11-17 ENCOUNTER — Emergency Department (INDEPENDENT_AMBULATORY_CARE_PROVIDER_SITE_OTHER)
Admission: EM | Admit: 2016-11-17 | Discharge: 2016-11-17 | Disposition: A | Discharge status: Home / Self Care | Payer: 59 | Source: Home / Self Care | Attending: Family Medicine | Admitting: Family Medicine

## 2016-11-17 ENCOUNTER — Encounter: Payer: Self-pay | Admitting: *Deleted

## 2016-11-17 DIAGNOSIS — B9789 Other viral agents as the cause of diseases classified elsewhere: Secondary | ICD-10-CM | POA: Diagnosis not present

## 2016-11-17 DIAGNOSIS — J069 Acute upper respiratory infection, unspecified: Secondary | ICD-10-CM

## 2016-11-17 MED ORDER — AMOXICILLIN 875 MG PO TABS: 875.0000 mg | ORAL_TABLET | Freq: Two times a day (BID) | ORAL | 0 refills | Status: DC

## 2016-11-17 MED ORDER — PREDNISONE 20 MG PO TABS: ORAL_TABLET | ORAL | 0 refills | Status: DC

## 2016-11-17 NOTE — ED Provider Notes (Signed)
[facility]    CSN: [id number] Arrival date & time: 11/17/16  0857     History   Chief Complaint Chief Complaint  Patient presents with  . Nasal Congestion    HPI Samantha Castro is a 46 y.o. female.   About 10 days ago patient developed typical cold-like symptoms developing over several days, including mild sore throat (now resolved), low grade fever 99.9, sinus congestion, fatigue, and cough.  She continues to have chills and has developed pressure in her face and ears.  She has a history of melanoma and is scheduled for repeat surgery. She has a past history of exercise asthma.   The history is provided by the patient.    Past Medical History:  Diagnosis Date  . Allergic rhinitis   . Cancer ()    melanoma  . Chronic kidney disease   . Endometriosis   . GERD (gastroesophageal reflux disease)   . Hypertension     There are no active problems to display for this patient.   Past Surgical History:  Procedure Laterality Date  . ABDOMINAL HYSTERECTOMY    . APPENDECTOMY    . ARTHROSCOPIC REPAIR ACL    . COLON RESECTION    . COLON SURGERY    . MELANOMA EXCISION    . PELVIC LAPAROSCOPY      OB History    No data available       Home Medications    Prior to Admission medications   Medication Sig Start Date End Date Taking? Authorizing Provider  losartan (COZAAR) 25 MG tablet Take 25 mg by mouth daily.   Yes [provider]  pantoprazole (PROTONIX) 40 MG tablet Take 40 mg by mouth daily.   Yes [provider]  amoxicillin (AMOXIL) 875 MG tablet Take 1 tablet (875 mg total) by mouth 2 (two) times daily. 11/17/16   Kandra Nicolas, MD  linaclotide (LINZESS) 290 MCG CAPS capsule Take 290 mcg by mouth daily before breakfast.    [provider]  Meth-Hyo-M Bl-Na Phos-Ph Sal (URIBEL) 118 MG CAPS Take by mouth.    [provider]  Multiple Vitamins-Minerals (MULTIVITAMIN WITH MINERALS) tablet Take 1 tablet by  mouth daily.    [provider]  predniSONE (DELTASONE) 20 MG tablet Take one tab by mouth twice daily for 5 days, then one daily for 3 days. Take with food. 11/17/16   Kandra Nicolas, MD    Family History Family History  Problem Relation Age of Onset  . Lung cancer Mother   . Hypertension Mother   . Colon cancer Mother   . Lung cancer Father   . Heart disease Father   . Hypertension Father     Social History Social History  Substance Use Topics  . Smoking status: Never Smoker  . Smokeless tobacco: Never Used  . Alcohol use No     Allergies   Bacitracin-polymyxin b and Lisinopril   Review of Systems Review of Systems No sore throat + cough No pleuritic pain No wheezing + nasal congestion + post-nasal drainage + sinus pain/pressure No itchy/red eyes ? earache No hemoptysis No SOB No fever, + chills No nausea No vomiting No abdominal pain No diarrhea No urinary symptoms No skin rash + fatigue No myalgias + headache Used OTC meds without relief   Physical Exam Triage Vital Signs ED Triage Vitals  Enc Vitals Group     BP 11/17/16 0930 (!) 167/106     Pulse Rate 11/17/16 0930  75     Resp 11/17/16 0930 16     Temp 11/17/16 0930 98.3 F (36.8 C)     Temp Source 11/17/16 0930 Oral     SpO2 11/17/16 0930 98 %     Weight 11/17/16 0931 202 lb (91.6 kg)     Height 11/17/16 0931 5\' 4"  (1.626 m)     Head Circumference --      Peak Flow --      Pain Score 11/17/16 0931 0     Pain Loc --      Pain Edu? --      Excl. in [place]? --    No data found.   Updated Vital Signs BP (!) 167/106 (BP Location: Left Arm)   Pulse 75   Temp 98.3 F (36.8 C) (Oral)   Resp 16   Ht 5\' 4"  (1.626 m)   Wt 202 lb (91.6 kg)   SpO2 98%   BMI 34.67 kg/m   Visual Acuity Right Eye Distance:   Left Eye Distance:   Bilateral Distance:    Right Eye Near:   Left Eye Near:    Bilateral Near:     Physical Exam Nursing notes and Vital Signs reviewed. Appearance:   Patient appears stated age, and in no acute distress Eyes:  Pupils are equal, round, and reactive to light and accomodation.  Extraocular movement is intact.  Conjunctivae are not inflamed  Ears:  Canals normal.  Tympanic membranes normal.  Nose:  Mildly congested turbinates.  Maxillary sinus tenderness is present.  Pharynx:  Normal Neck:  Supple.  Enlarged posterior/lateral nodes are palpated bilaterally, tender to palpation on the left.   Lungs:  Clear to auscultation.  Breath sounds are equal.  Moving air well. Heart:  Regular rate and rhythm without murmurs, rubs, or gallops.  Abdomen:  Nontender without masses or hepatosplenomegaly.  Bowel sounds are present.  No CVA or flank tenderness.  Extremities:  No edema.  Skin:  No rash present.    UC Treatments / Results  Labs (all labs ordered are listed, but only abnormal results are displayed) Labs Reviewed - No data to display  EKG  EKG Interpretation None       Radiology No results found.  Procedures Procedures (including critical care time)  Medications Ordered in UC Medications - No data to display   Initial Impression / Assessment and Plan / UC Course  I have reviewed the triage vital signs and the nursing notes.  Pertinent labs & imaging results that were available during my care of the patient were reviewed by me and considered in my medical decision making (see chart for details).    Begin empiric amoxicillin 875mg  BID, and prednisone burst/taper. Take plain guaifenesin (1200mg  extended release tabs such as Mucinex) twice daily, with plenty of water, for cough and congestion.  Get adequate rest.   May use Afrin nasal spray (or generic oxymetazoline) each morning for about 5 days and then discontinue.  Also recommend using saline nasal spray several times daily and saline nasal irrigation (AYR is a common brand).  Use Nasacort nasal spray each morning after using Afrin nasal spray and saline nasal irrigation. Try  warm salt water gargles for sore throat.  Stop all antihistamines for now, and other non-prescription cough/cold preparations. May take Delsym Cough Suppressant at bedtime for nighttime cough.  Follow-up with family doctor if not improving about10 days.     Final Clinical Impressions(s) / UC Diagnoses   Final diagnoses:  Viral URI with cough    New Prescriptions New Prescriptions   AMOXICILLIN (AMOXIL) 875 MG TABLET    Take 1 tablet (875 mg total) by mouth 2 (two) times daily.   PREDNISONE (DELTASONE) 20 MG TABLET    Take one tab by mouth twice daily for 5 days, then one daily for 3 days. Take with food.     Kandra Nicolas, MD 11/22/16 [phone]

## 2016-11-17 NOTE — Discharge Instructions (Signed)
Take plain guaifenesin (1200mg  extended release tabs such as Mucinex) twice daily, with plenty of water, for cough and congestion.  Get adequate rest.   May use Afrin nasal spray (or generic oxymetazoline) each morning for about 5 days and then discontinue.  Also recommend using saline nasal spray several times daily and saline nasal irrigation (AYR is a common brand).  Use Nasacort nasal spray each morning after using Afrin nasal spray and saline nasal irrigation. Try warm salt water gargles for sore throat.  Stop all antihistamines for now, and other non-prescription cough/cold preparations. May take Delsym Cough Suppressant at bedtime for nighttime cough.  Follow-up with family doctor if not improving about10 days.

## 2016-11-17 NOTE — ED Triage Notes (Signed)
Pt c/o bilateral ear pressure, nonproductive cough and green nasal congestion x 11/08/16. Reports temp of 99.9 last wk.

## 2016-12-05 ENCOUNTER — Encounter (HOSPITAL_BASED_OUTPATIENT_CLINIC_OR_DEPARTMENT_OTHER): Payer: Self-pay | Admitting: *Deleted

## 2016-12-11 ENCOUNTER — Encounter (HOSPITAL_BASED_OUTPATIENT_CLINIC_OR_DEPARTMENT_OTHER)
Admission: RE | Admit: 2016-12-11 | Discharge: 2016-12-11 | Disposition: A | Discharge status: Home / Self Care | Payer: 59 | Source: Ambulatory Visit | Attending: Surgery | Admitting: Surgery

## 2016-12-11 DIAGNOSIS — C4361 Malignant melanoma of right upper limb, including shoulder: Secondary | ICD-10-CM | POA: Diagnosis not present

## 2016-12-11 DIAGNOSIS — L814 Other melanin hyperpigmentation: Secondary | ICD-10-CM | POA: Diagnosis not present

## 2016-12-11 DIAGNOSIS — K219 Gastro-esophageal reflux disease without esophagitis: Secondary | ICD-10-CM | POA: Diagnosis not present

## 2016-12-11 DIAGNOSIS — Z87442 Personal history of urinary calculi: Secondary | ICD-10-CM | POA: Diagnosis not present

## 2016-12-11 DIAGNOSIS — I10 Essential (primary) hypertension: Secondary | ICD-10-CM | POA: Diagnosis not present

## 2016-12-11 NOTE — Progress Notes (Signed)
EKG reviewed and anesthesia consult completed by Dr. R.Fitzgerald, will proceed with surgery as scheduled.

## 2016-12-11 NOTE — Anesthesia Preprocedure Evaluation (Addendum)
Anesthesia Evaluation  Patient identified by MRN, date of birth, ID band Patient awake    Reviewed: Allergy & Precautions, NPO status , Patient's Chart, lab work & pertinent test results  History of Anesthesia Complications (+) PONV  Airway Mallampati: II  TM Distance: >3 FB Neck ROM: Full    Dental no notable dental hx.    Pulmonary neg pulmonary ROS,    Pulmonary exam normal breath sounds clear to auscultation       Cardiovascular hypertension, Pt. on medications negative cardio ROS Normal cardiovascular exam Rhythm:Regular Rate:Normal     Neuro/Psych negative neurological ROS  negative psych ROS   GI/Hepatic negative GI ROS, Neg liver ROS, GERD  ,  Endo/Other  negative endocrine ROS  Renal/GU negative Renal ROS  negative genitourinary   Musculoskeletal negative musculoskeletal ROS (+)   Abdominal   Peds negative pediatric ROS (+)  Hematology negative hematology ROS (+)   Anesthesia Other Findings Melanoma  Reproductive/Obstetrics negative OB ROS                            Anesthesia Physical Anesthesia Plan  ASA: III  Anesthesia Plan: General   Post-op Pain Management:    Induction: Intravenous  PONV Risk Score and Plan: 4 or greater and Ondansetron, Dexamethasone, Propofol, Midazolam and Treatment may vary due to age or medical condition  Airway Management Planned: LMA  Additional Equipment:   Intra-op Plan:   Post-operative Plan: Extubation in OR  Informed Consent: I have reviewed the patients History and Physical, chart, labs and discussed the procedure including the risks, benefits and alternatives for the proposed anesthesia with the patient or authorized representative who has indicated his/her understanding and acceptance.   Dental advisory given  Plan Discussed with: CRNA  Anesthesia Plan Comments: (Pt seen in consultation at the request of Dr. Brantley Stage. Pt  with history of severe PONV with every anesthetic previously often times requiring admission for uncontrolled N/V. Pt has been nauseated with colonoscopies, knee scopes and obgyn surgery. Discussed potential options for patient including general anesthesia with TIVA or MAC. Given the amount of stimulation for the sentinel node part of the operation we discussed more likely that [place] would be required but that decision could be made with herself and her anesthesiologist day of surgery. We discussed using several different drugs to try and prevent PONV, and that while they lower her risk it still will not be a 0% chance of PONV, but that we will use multiple medications and do the best we could.  Deatra Canter)       Anesthesia Quick Evaluation

## 2016-12-15 ENCOUNTER — Ambulatory Visit (HOSPITAL_BASED_OUTPATIENT_CLINIC_OR_DEPARTMENT_OTHER): Payer: 59 | Admitting: Anesthesiology

## 2016-12-15 ENCOUNTER — Ambulatory Visit (HOSPITAL_BASED_OUTPATIENT_CLINIC_OR_DEPARTMENT_OTHER)
Admission: RE | Admit: 2016-12-15 | Discharge: 2016-12-15 | Disposition: A | Discharge status: Home / Self Care | Payer: 59 | Source: Ambulatory Visit | Attending: Surgery | Admitting: Surgery

## 2016-12-15 ENCOUNTER — Encounter (HOSPITAL_BASED_OUTPATIENT_CLINIC_OR_DEPARTMENT_OTHER)
Admission: RE | Disposition: A | Discharge status: Home / Self Care | Payer: Self-pay | Source: Ambulatory Visit | Attending: Surgery

## 2016-12-15 ENCOUNTER — Ambulatory Visit (HOSPITAL_COMMUNITY)
Admission: RE | Admit: 2016-12-15 | Discharge: 2016-12-15 | Disposition: A | Discharge status: Home / Self Care | Payer: 59 | Source: Ambulatory Visit | Attending: Surgery | Admitting: Surgery

## 2016-12-15 ENCOUNTER — Encounter (HOSPITAL_BASED_OUTPATIENT_CLINIC_OR_DEPARTMENT_OTHER): Payer: Self-pay | Admitting: Anesthesiology

## 2016-12-15 DIAGNOSIS — I10 Essential (primary) hypertension: Secondary | ICD-10-CM | POA: Diagnosis not present

## 2016-12-15 DIAGNOSIS — C439 Malignant melanoma of skin, unspecified: Secondary | ICD-10-CM

## 2016-12-15 DIAGNOSIS — Z87442 Personal history of urinary calculi: Secondary | ICD-10-CM | POA: Diagnosis not present

## 2016-12-15 DIAGNOSIS — K219 Gastro-esophageal reflux disease without esophagitis: Secondary | ICD-10-CM | POA: Insufficient documentation

## 2016-12-15 DIAGNOSIS — C4361 Malignant melanoma of right upper limb, including shoulder: Secondary | ICD-10-CM | POA: Diagnosis not present

## 2016-12-15 DIAGNOSIS — R599 Enlarged lymph nodes, unspecified: Secondary | ICD-10-CM | POA: Diagnosis not present

## 2016-12-15 DIAGNOSIS — L814 Other melanin hyperpigmentation: Secondary | ICD-10-CM | POA: Insufficient documentation

## 2016-12-15 HISTORY — DX: Endometriosis, unspecified: N80.9

## 2016-12-15 HISTORY — DX: Malignant (primary) neoplasm, unspecified: C80.1

## 2016-12-15 HISTORY — PX: EXCISION MELANOMA WITH SENTINEL LYMPH NODE BIOPSY: SHX5628

## 2016-12-15 HISTORY — DX: Gastro-esophageal reflux disease without esophagitis: K21.9

## 2016-12-15 HISTORY — DX: Other specified postprocedural states: R11.2

## 2016-12-15 HISTORY — DX: Other specified postprocedural states: Z98.890

## 2016-12-15 HISTORY — DX: Allergic rhinitis, unspecified: J30.9

## 2016-12-15 HISTORY — DX: Essential (primary) hypertension: I10

## 2016-12-15 SURGERY — EXCISION, MELANOMA, WITH SENTINEL LYMPH NODE BIOPSY
Anesthesia: General | Site: Axilla | Laterality: Right

## 2016-12-15 MED ORDER — CEFAZOLIN SODIUM-DEXTROSE 2-4 GM/100ML-% IV SOLN
INTRAVENOUS | Status: AC
Start: 2016-12-15 — End: 2016-12-15
  Filled 2016-12-15: qty 100

## 2016-12-15 MED ORDER — LIDOCAINE HCL (CARDIAC) 20 MG/ML IV SOLN
INTRAVENOUS | Status: AC
  Filled 2016-12-15: qty 5

## 2016-12-15 MED ORDER — MEPERIDINE HCL 25 MG/ML IJ SOLN: 6.2500 mg | INTRAMUSCULAR | Status: DC | PRN

## 2016-12-15 MED ORDER — CELECOXIB 400 MG PO CAPS
400.0000 mg | ORAL_CAPSULE | ORAL | Status: AC
  Administered 2016-12-15: 400 mg via ORAL

## 2016-12-15 MED ORDER — OXYCODONE HCL 5 MG PO TABS: 5.0000 mg | ORAL_TABLET | Freq: Once | ORAL | Status: DC | PRN

## 2016-12-15 MED ORDER — SODIUM CHLORIDE 0.9 % IJ SOLN
INTRAMUSCULAR | Status: AC
  Filled 2016-12-15: qty 10

## 2016-12-15 MED ORDER — ACETAMINOPHEN 500 MG PO TABS
ORAL_TABLET | ORAL | Status: AC
  Filled 2016-12-15: qty 2

## 2016-12-15 MED ORDER — ACETAMINOPHEN 500 MG PO TABS
1000.0000 mg | ORAL_TABLET | ORAL | Status: AC
  Administered 2016-12-15: 1000 mg via ORAL

## 2016-12-15 MED ORDER — FENTANYL CITRATE (PF) 100 MCG/2ML IJ SOLN
INTRAMUSCULAR | Status: AC
  Filled 2016-12-15: qty 2

## 2016-12-15 MED ORDER — OXYCODONE HCL 5 MG PO TABS: 5.0000 mg | ORAL_TABLET | Freq: Four times a day (QID) | ORAL | 0 refills | Status: DC | PRN

## 2016-12-15 MED ORDER — ONDANSETRON HCL 4 MG/2ML IJ SOLN
INTRAMUSCULAR | Status: AC
  Filled 2016-12-15: qty 2

## 2016-12-15 MED ORDER — DEXAMETHASONE SODIUM PHOSPHATE 4 MG/ML IJ SOLN
INTRAMUSCULAR | Status: DC | PRN
  Administered 2016-12-15: 10 mg via INTRAVENOUS

## 2016-12-15 MED ORDER — TECHNETIUM TC 99M SULFUR COLLOID FILTERED: 0.5000 | Freq: Once | INTRAVENOUS | Status: DC | PRN

## 2016-12-15 MED ORDER — PROMETHAZINE HCL 25 MG/ML IJ SOLN
6.2500 mg | INTRAMUSCULAR | Status: DC | PRN
  Administered 2016-12-15: 6.25 mg via INTRAVENOUS

## 2016-12-15 MED ORDER — GABAPENTIN 300 MG PO CAPS
ORAL_CAPSULE | ORAL | Status: AC
  Filled 2016-12-15: qty 1

## 2016-12-15 MED ORDER — LACTATED RINGERS IV SOLN
INTRAVENOUS | Status: DC
Start: 2016-12-15 — End: 2016-12-15
  Administered 2016-12-15 (×3): via INTRAVENOUS

## 2016-12-15 MED ORDER — DEXAMETHASONE SODIUM PHOSPHATE 10 MG/ML IJ SOLN
INTRAMUSCULAR | Status: AC
  Filled 2016-12-15: qty 1

## 2016-12-15 MED ORDER — METHYLENE BLUE 0.5 % INJ SOLN
INTRAVENOUS | Status: DC | PRN
  Administered 2016-12-15: 1 mL

## 2016-12-15 MED ORDER — PROPOFOL 500 MG/50ML IV EMUL
INTRAVENOUS | Status: AC
  Filled 2016-12-15: qty 50

## 2016-12-15 MED ORDER — IBUPROFEN 800 MG PO TABS: 800.0000 mg | ORAL_TABLET | Freq: Three times a day (TID) | ORAL | 0 refills | Status: DC | PRN

## 2016-12-15 MED ORDER — DIPHENHYDRAMINE HCL 50 MG/ML IJ SOLN
INTRAMUSCULAR | Status: DC | PRN
  Administered 2016-12-15 (×2): 25 mg via INTRAVENOUS

## 2016-12-15 MED ORDER — SCOPOLAMINE 1 MG/3DAYS TD PT72
1.0000 | MEDICATED_PATCH | Freq: Once | TRANSDERMAL | Status: DC | PRN
  Administered 2016-12-15: 1.5 mg via TRANSDERMAL

## 2016-12-15 MED ORDER — PROPOFOL 500 MG/50ML IV EMUL
INTRAVENOUS | Status: DC | PRN
  Administered 2016-12-15: 100 ug/kg/min via INTRAVENOUS

## 2016-12-15 MED ORDER — MIDAZOLAM HCL 2 MG/2ML IJ SOLN
1.0000 mg | INTRAMUSCULAR | Status: DC | PRN
  Administered 2016-12-15: 1 mg via INTRAVENOUS

## 2016-12-15 MED ORDER — GABAPENTIN 300 MG PO CAPS
300.0000 mg | ORAL_CAPSULE | ORAL | Status: AC
  Administered 2016-12-15: 300 mg via ORAL

## 2016-12-15 MED ORDER — DIPHENHYDRAMINE HCL 50 MG/ML IJ SOLN
INTRAMUSCULAR | Status: AC
  Filled 2016-12-15: qty 1

## 2016-12-15 MED ORDER — METHYLENE BLUE 0.5 % INJ SOLN
INTRAVENOUS | Status: AC
  Filled 2016-12-15: qty 10

## 2016-12-15 MED ORDER — PHENYLEPHRINE 40 MCG/ML (10ML) SYRINGE FOR IV PUSH (FOR BLOOD PRESSURE SUPPORT)
PREFILLED_SYRINGE | INTRAVENOUS | Status: AC
  Filled 2016-12-15: qty 10

## 2016-12-15 MED ORDER — PROMETHAZINE HCL 25 MG/ML IJ SOLN
INTRAMUSCULAR | Status: AC
  Filled 2016-12-15: qty 1

## 2016-12-15 MED ORDER — ONDANSETRON HCL 4 MG/2ML IJ SOLN: 4.0000 mg | Freq: Once | INTRAMUSCULAR | Status: DC

## 2016-12-15 MED ORDER — PROPOFOL 10 MG/ML IV BOLUS
INTRAVENOUS | Status: AC
  Filled 2016-12-15: qty 20

## 2016-12-15 MED ORDER — FENTANYL CITRATE (PF) 100 MCG/2ML IJ SOLN
INTRAMUSCULAR | Status: DC | PRN
  Administered 2016-12-15 (×2): 50 ug via INTRAVENOUS

## 2016-12-15 MED ORDER — SODIUM CHLORIDE 0.9 % IJ SOLN
INTRAMUSCULAR | Status: DC | PRN
  Administered 2016-12-15: 3 mL

## 2016-12-15 MED ORDER — MIDAZOLAM HCL 2 MG/2ML IJ SOLN
INTRAMUSCULAR | Status: AC
  Filled 2016-12-15: qty 2

## 2016-12-15 MED ORDER — ONDANSETRON HCL 4 MG/2ML IJ SOLN
INTRAMUSCULAR | Status: DC | PRN
  Administered 2016-12-15 (×2): 4 mg via INTRAVENOUS

## 2016-12-15 MED ORDER — CELECOXIB 200 MG PO CAPS
ORAL_CAPSULE | ORAL | Status: AC
  Filled 2016-12-15: qty 2

## 2016-12-15 MED ORDER — CHLORHEXIDINE GLUCONATE CLOTH 2 % EX PADS
6.0000 | MEDICATED_PAD | Freq: Once | CUTANEOUS | Status: DC
Start: 2016-12-15 — End: 2016-12-15

## 2016-12-15 MED ORDER — MIDAZOLAM HCL 5 MG/5ML IJ SOLN
INTRAMUSCULAR | Status: DC | PRN
  Administered 2016-12-15 (×2): 1 mg via INTRAVENOUS

## 2016-12-15 MED ORDER — HYDROMORPHONE HCL 1 MG/ML IJ SOLN: 0.2500 mg | INTRAMUSCULAR | Status: DC | PRN

## 2016-12-15 MED ORDER — FENTANYL CITRATE (PF) 100 MCG/2ML IJ SOLN
50.0000 ug | INTRAMUSCULAR | Status: DC | PRN
  Administered 2016-12-15: 50 ug via INTRAVENOUS

## 2016-12-15 MED ORDER — PROPOFOL 10 MG/ML IV BOLUS
INTRAVENOUS | Status: DC | PRN
  Administered 2016-12-15: 150 mg via INTRAVENOUS

## 2016-12-15 MED ORDER — CEFAZOLIN SODIUM-DEXTROSE 2-4 GM/100ML-% IV SOLN
2.0000 g | INTRAVENOUS | Status: AC
  Administered 2016-12-15: 2 g via INTRAVENOUS

## 2016-12-15 MED ORDER — ONDANSETRON 8 MG PO TBDP: 8.0000 mg | ORAL_TABLET | Freq: Three times a day (TID) | ORAL | 0 refills | Status: DC | PRN

## 2016-12-15 MED ORDER — CHLORHEXIDINE GLUCONATE CLOTH 2 % EX PADS: 6.0000 | MEDICATED_PAD | Freq: Once | CUTANEOUS | Status: DC

## 2016-12-15 MED ORDER — SCOPOLAMINE 1 MG/3DAYS TD PT72
MEDICATED_PATCH | TRANSDERMAL | Status: AC
  Filled 2016-12-15: qty 1

## 2016-12-15 MED ORDER — OXYCODONE HCL 5 MG/5ML PO SOLN: 5.0000 mg | Freq: Once | ORAL | Status: DC | PRN

## 2016-12-15 SURGICAL SUPPLY — 58 items
ADH SKN CLS APL DERMABOND .7 (GAUZE/BANDAGES/DRESSINGS) ×1
APL SKNCLS STERI-STRIP NONHPOA (GAUZE/BANDAGES/DRESSINGS) ×1
APPLIER CLIP 9.375 MED OPEN (MISCELLANEOUS) ×2
APR CLP MED 9.3 20 MLT OPN (MISCELLANEOUS) ×1
BANDAGE ACE 6X5 VEL STRL LF (GAUZE/BANDAGES/DRESSINGS) ×2 IMPLANT
BENZOIN TINCTURE PRP APPL 2/3 (GAUZE/BANDAGES/DRESSINGS) ×2 IMPLANT
BLADE CLIPPER SURG (BLADE) IMPLANT
BLADE SURG 10 STRL SS (BLADE) ×2 IMPLANT
BLADE SURG 15 STRL LF DISP TIS (BLADE) ×1 IMPLANT
BLADE SURG 15 STRL SS (BLADE) ×2
CANISTER SUCT 1200ML W/VALVE (MISCELLANEOUS) ×2 IMPLANT
CHLORAPREP W/TINT 26ML (MISCELLANEOUS) ×2 IMPLANT
CLIP APPLIE 9.375 MED OPEN (MISCELLANEOUS) ×1 IMPLANT
COVER BACK TABLE 60X90IN (DRAPES) ×2 IMPLANT
COVER MAYO STAND STRL (DRAPES) ×2 IMPLANT
COVER PROBE W GEL 5X96 (DRAPES) ×2 IMPLANT
DECANTER SPIKE VIAL GLASS SM (MISCELLANEOUS) IMPLANT
DERMABOND ADVANCED (GAUZE/BANDAGES/DRESSINGS) ×1
DERMABOND ADVANCED .7 DNX12 (GAUZE/BANDAGES/DRESSINGS) ×1 IMPLANT
DRAPE LAPAROTOMY 100X72 PEDS (DRAPES) ×2 IMPLANT
DRAPE UTILITY XL STRL (DRAPES) ×2 IMPLANT
DRSG TEGADERM 4X4.75 (GAUZE/BANDAGES/DRESSINGS) ×4 IMPLANT
ELECT COATED BLADE 2.86 ST (ELECTRODE) ×2 IMPLANT
ELECT REM PT RETURN 9FT ADLT (ELECTROSURGICAL) ×2
ELECTRODE REM PT RTRN 9FT ADLT (ELECTROSURGICAL) ×1 IMPLANT
GAUZE SPONGE 4X4 12PLY STRL LF (GAUZE/BANDAGES/DRESSINGS) IMPLANT
GLOVE BIO SURGEON STRL SZ 6.5 (GLOVE) ×1 IMPLANT
GLOVE BIOGEL M 7.0 STRL (GLOVE) ×1 IMPLANT
GLOVE BIOGEL PI IND STRL 8 (GLOVE) ×1 IMPLANT
GLOVE BIOGEL PI INDICATOR 8 (GLOVE) ×1
GLOVE ECLIPSE 8.0 STRL XLNG CF (GLOVE) ×2 IMPLANT
GOWN STRL REUS W/ TWL LRG LVL3 (GOWN DISPOSABLE) ×2 IMPLANT
GOWN STRL REUS W/TWL LRG LVL3 (GOWN DISPOSABLE) ×4
HEMOSTAT SNOW SURGICEL 2X4 (HEMOSTASIS) ×1 IMPLANT
NDL HYPO 25X1 1.5 SAFETY (NEEDLE) ×2 IMPLANT
NDL SAFETY ECLIPSE 18X1.5 (NEEDLE) ×1 IMPLANT
NEEDLE HYPO 18GX1.5 SHARP (NEEDLE) ×2
NEEDLE HYPO 25X1 1.5 SAFETY (NEEDLE) ×4 IMPLANT
NS IRRIG 1000ML POUR BTL (IV SOLUTION) ×2 IMPLANT
PACK BASIN DAY SURGERY FS (CUSTOM PROCEDURE TRAY) ×2 IMPLANT
PENCIL BUTTON HOLSTER BLD 10FT (ELECTRODE) ×2 IMPLANT
SLEEVE SCD COMPRESS KNEE MED (MISCELLANEOUS) ×1 IMPLANT
SPONGE LAP 4X18 X RAY DECT (DISPOSABLE) ×2 IMPLANT
STAPLER VISISTAT 35W (STAPLE) ×2 IMPLANT
STRIP CLOSURE SKIN 1/2X4 (GAUZE/BANDAGES/DRESSINGS) ×2 IMPLANT
SUT ETHILON 2 0 FS 18 (SUTURE) IMPLANT
SUT ETHILON 3 0 PS 1 (SUTURE) IMPLANT
SUT MNCRL AB 4-0 PS2 18 (SUTURE) ×1 IMPLANT
SUT MON AB 4-0 PC3 18 (SUTURE) ×2 IMPLANT
SUT SILK 2 0 SH (SUTURE) IMPLANT
SUT VIC AB 2-0 SH 27 (SUTURE) ×2
SUT VIC AB 2-0 SH 27XBRD (SUTURE) ×1 IMPLANT
SUT VICRYL 3-0 CR8 SH (SUTURE) ×2 IMPLANT
SYR 5ML LL (SYRINGE) ×2 IMPLANT
SYR CONTROL 10ML LL (SYRINGE) ×2 IMPLANT
TOWEL OR 17X24 6PK STRL BLUE (TOWEL DISPOSABLE) ×2 IMPLANT
TOWEL OR NON WOVEN STRL DISP B (DISPOSABLE) ×2 IMPLANT
YANKAUER SUCT BULB TIP NO VENT (SUCTIONS) ×2 IMPLANT

## 2016-12-15 NOTE — Anesthesia Postprocedure Evaluation (Signed)
Anesthesia Post Note  Patient: Flor Whitacre  Procedure(s) Performed: Procedure(s) (LRB): WIDE EXCISION RIGHT WRIST  MELANOMA WITH RIGHT SENTINEL NODE MAPPING (Right)     Patient location during evaluation: PACU Anesthesia Type: General Level of consciousness: awake and alert Pain management: pain level controlled Vital Signs Assessment: post-procedure vital signs reviewed and stable Respiratory status: spontaneous breathing, nonlabored ventilation and respiratory function stable Cardiovascular status: blood pressure returned to baseline and stable Postop Assessment: no signs of nausea or vomiting Anesthetic complications: no    Last Vitals:  Vitals:   12/15/16 1515 12/15/16 1530  BP: (!) 143/83   Pulse: 61 65  Resp: 18 18  Temp:      Last Pain:  Vitals:   12/15/16 1530  TempSrc:   PainSc: 0-No pain                 Lynda Rainwater

## 2016-12-15 NOTE — H&P (Addendum)
H&P Encounter Date:  Samantha Luna, MD  Surgery    [] Hide copied text Samantha Castro  Location: [facility] Patient #: [id number] DOB: Jul 20, 1970 Married / Language: English / Race: Refused to Report/Unreported Female  History of Present Illness Samantha Castro A. [name] MD; Patient words: melanoma     The patient sent at the request of Dr. Danella Castro for a right wrist melanoma. She was seen by her dermatologist she had a spot on the dorsum of her right wrist that look suspicious. Biopsy was done which showed a 0.9 mm nevoid melanoma dorsum right wrist. There is no ulceration. There is no evidence of regression. The deep shave margin was clear. She has a history of multiple skin lesions that have been removed by her dermatologist in the past. 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 She has no other complaints today.  The patient is a 46 year old female.   Past Surgical History Samantha Castro, CMA; Appendectomy Hysterectomy (not due to cancer) - Complete Knee Surgery Left. Resection of Small Bowel  Diagnostic Studies History Samantha Castro, CMA; ) Colonoscopy 1-5 years ago Mammogram within last year Pap Smear 1-5 years ago  Allergies Samantha Castro, CMA;  No Known Drug Allergies 10/28/2016  Medication History (Samantha Castro, CMA;  Protonix (Oral) Specific strength unknown - Active. Losartan Potassium (Oral) Specific strength unknown - Active. Medications Reconciled  Social History Samantha Castro, CMA;  Caffeine use Carbonated beverages. No alcohol use Tobacco use Never smoker.  Family History Samantha Castro, CMA; Anesthetic complications Mother. Cancer Mother. Cerebrovascular Accident Father, Mother. Colon Cancer Mother. Colon Polyps Mother. Depression Brother. Rectal Cancer Father.  Pregnancy / Birth History Samantha Castro, CMA; Age at menarche 45 years. Contraceptive History  Oral contraceptives. Gravida 1 Length (months) of breastfeeding 7-12 Maternal age 42-30 Para 1  Other Problems Samantha Castro, CMA; Back Pain Gastroesophageal Reflux Disease General anesthesia - complications High blood pressure Kidney Stone Melanoma Transfusion history     Review of Systems Samantha Castro CMA General Not Present- Appetite Loss, Chills, Fatigue, Fever, Night Sweats, Weight Gain and Weight Loss. Skin Present- Change in Wart/Mole. Not Present- Dryness, Hives, Jaundice, New Lesions, Non-Healing Wounds, Rash and Ulcer. HEENT Present- Seasonal Allergies. Not Present- Earache, Hearing Loss, Hoarseness, Nose Bleed, Oral Ulcers, Ringing in the Ears, Sinus Pain, Sore Throat, Visual Disturbances, Wears glasses/contact lenses and Yellow Eyes. Gastrointestinal Present- Constipation. Not Present- Abdominal Pain, Bloating, Bloody Stool, Change in Bowel Habits, Chronic diarrhea, Difficulty Swallowing, Excessive gas, Gets full quickly at meals, Hemorrhoids, Indigestion, Nausea, Rectal Pain and Vomiting.  Vitals [employer] R. Castro CMA;10/28/2016 1:37 PM Weight: 201.5 lb Height: 64in Body Surface Area: 1.96 m Body Mass Index: 34.59 kg/m  BP: 150/92 (Sitting, Left Arm, Standard)      Physical Exam (Samantha Castro A. Jerriyah Louis MD;   General Mental Status-Alert. General Appearance-Consistent with stated age. Hydration-Well hydrated. Voice-Normal.  Integumentary Note: Dorsum of right wrist scar from the previous shave site. This measures approximately 8 mm.  Eye Eyeball - Bilateral-Extraocular movements intact. Sclera/Conjunctiva - Bilateral-No scleral icterus.  Chest and Lung Exam Chest and lung exam reveals -quiet, even and easy respiratory effort with no use of accessory muscles and on auscultation, normal breath sounds, no adventitious sounds and normal vocal resonance. Inspection Chest Wall - Normal. Back -  normal.  Cardiovascular Cardiovascular examination reveals -normal heart sounds, regular rate and rhythm with no murmurs and normal pedal pulses bilaterally.  Neurologic Neurologic evaluation reveals -alert  and oriented x 3 with no impairment of recent or remote memory. Mental Status-Normal.  Lymphatic Head & Neck  General Head & Neck Lymphatics: Bilateral - Description - Normal. Axillary  General Axillary Region: Bilateral - Description - Normal. Tenderness - Non Tender.      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 She has a duskiness area to make this possible. Discussed sentinel lymph node mapping and the pros and cons of doing this. Current national recommendations are for sentinel lymph node mapping for any melanomas greater than 8 mm in depth. I discussed management of days or if she does have positive lymph node which for now is Risk of sentinel lymph node mapping include bleeding, infection, lymphedema, shoulder pain. stiffness, dye allergy. cosmetic deformity , blood clots, death, need for more surgery. Pt agres to proceed. observation.  Current Plans Pt Education - Melanoma: cancer You are being scheduled for surgery- Our schedulers will call you.  You should hear from our office's scheduling department within 5 working days about the location, date, and time of surgery. We try to make accommodations for patient's preferences in scheduling surgery, but sometimes the OR schedule or the surgeon's schedule prevents [place] from making those accommodations.  If you have not heard from our office [phone]) in 5 working days, call the office and ask for your surgeon's nurse.  If you have other questions about your diagnosis, plan, or surgery, call the office and ask for your surgeon's nurse.  The pathophysiology of skin & subcutaneous masses was discussed. Natural history risks without surgery were  discussed. I recommended surgery to remove the mass. I explained the technique of removal with use of local anesthesia & possible need for more aggressive sedation/anesthesia for patient comfort.  Risks such as bleeding, infection, wound breakdown, heart attack, death, and other risks were discussed. I noted a good likelihood this will help address the problem. Possibility that this will not correct all symptoms was explained. Possibility of regrowth/recurrence of the mass was discussed. We will work to minimize complications. Questions were answered. The patient expresses understanding & wishes to proceed with surgery.  MELANOMA OF WRIST (C43.60)    Electronically signed by Samantha Luna, MD at             Routing History        Detailed Report

## 2016-12-15 NOTE — Transfer of Care (Signed)
Immediate Anesthesia Transfer of Care Note  Patient: Samantha Castro  Procedure(s) Performed: Procedure(s): WIDE EXCISION RIGHT WRIST  MELANOMA WITH RIGHT SENTINEL NODE MAPPING (Right)  Patient Location: PACU  Anesthesia Type:General  Level of Consciousness: awake and patient cooperative  Airway & Oxygen Therapy: Patient Spontanous Breathing and Patient connected to face mask oxygen  Post-op Assessment: Report given to RN and Post -op Vital signs reviewed and stable  Post vital signs: Reviewed and stable  Last Vitals:  Vitals:   12/15/16 1230 12/15/16 1235  BP: (!) 142/89   Pulse: 69 73  Resp: 19 17  Temp:      Last Pain:  Vitals:   12/15/16 1148  TempSrc: Oral         Complications: No apparent anesthesia complications

## 2016-12-15 NOTE — Interval H&P Note (Signed)
History and Physical Interval Note:  12/15/2016 1:15 PM  Samantha Castro  has presented today for surgery, with the diagnosis of melanoma  The various methods of treatment have been discussed with the patient and family. After consideration of risks, benefits and other options for treatment, the patient has consented to  Procedure(s): WIDE EXCISION RIGHT WRIST  MELANOMA WITH RIGHT SENTINEL NODE MAPPING (Right) as a surgical intervention .  The patient's history has been reviewed, patient examined, no change in status, stable for surgery.  I have reviewed the patient's chart and labs.  Questions were answered to the patient's satisfaction.     Waldon Sheerin A.

## 2016-12-15 NOTE — Discharge Instructions (Signed)
Apply ice to both areas 3 - 4 times a day for 30 minutes  Remove ACE and shower Tuesday        May use the wrist but it will feel tight for 2 - 4 weeks     Sutures and glue will fall off in 2 - 3 weeks  No need to wrap unless desired      Call for repot in 3 days  [phone]    Urine may appear green for 24 hrs secondary to dye used    Post Anesthesia Home Care Instructions  Activity: Get plenty of rest for the remainder of the day. A responsible individual must stay with you for 24 hours following the procedure.  For the next 24 hours, DO NOT: -Drive a car -Paediatric nurse -Drink alcoholic beverages -Take any medication unless instructed by your physician -Make any legal decisions or sign important papers.  Meals: Start with liquid foods such as gelatin or soup. Progress to regular foods as tolerated. Avoid greasy, spicy, heavy foods. If nausea and/or vomiting occur, drink only clear liquids until the nausea and/or vomiting subsides. Call your physician if vomiting continues.  Special Instructions/Symptoms: Your throat may feel dry or sore from the anesthesia or the breathing tube placed in your throat during surgery. If this causes discomfort, gargle with warm salt water. The discomfort should disappear within 24 hours.  If you had a scopolamine patch placed behind your ear for the management of post- operative nausea and/or vomiting:  1. The medication in the patch is effective for 72 hours, after which it should be removed.  Wrap patch in a tissue and discard in the trash. Wash hands thoroughly with soap and water. 2. You may remove the patch earlier than 72 hours if you experience unpleasant side effects which may include dry mouth, dizziness or visual disturbances. 3. Avoid touching the patch. Wash your hands with soap and water after contact with the patch.

## 2016-12-15 NOTE — Anesthesia Procedure Notes (Signed)
Procedure Name: LMA Insertion Date/Time: 12/15/2016 1:32 PM Performed by: Toula Moos L Pre-anesthesia Checklist: Patient identified, Emergency Drugs available, Suction available, Patient being monitored and Timeout performed Patient Re-evaluated:Patient Re-evaluated prior to induction Oxygen Delivery Method: Circle system utilized Preoxygenation: Pre-oxygenation with 100% oxygen Induction Type: IV induction Ventilation: Mask ventilation without difficulty LMA: LMA inserted LMA Size: 4.0 Number of attempts: 1 Airway Equipment and Method: Bite block Placement Confirmation: positive ETCO2 Tube secured with: Tape Dental Injury: Teeth and Oropharynx as per pre-operative assessment

## 2016-12-15 NOTE — Progress Notes (Signed)
Assisted nuc med tech #[id number] withnuc med inj. Side rails up, monitors on throughout procedure. See vital signs in flow sheet. Tolerated Procedure well. 

## 2016-12-15 NOTE — Op Note (Signed)
Preoperative diagnosis: Melanoma involving the dorsum of right wrist  Postoperative diagnosis: Same  Procedure: Wide excision of right wrist melanoma with right axillary sentinel lymph node mapping with methylene blue dye for deep right axillary lymph node  Surgeon Erroll Luna M.D.  Anesthesia: LMA with 0.25% Sensorcaine local with epinephrine  EBL: Minimal  Specimen: Scar from previous excision right wrist melanoma dorsum sent to pathology with 1 cm gross margin and 2 right axillary sentinel nodes  Drains: None  Indications for procedure: Patient presents for wide excision of right wrist melanoma. She underwent an excisional biopsy better collagen which showed a 0.9 mm melanoma of the dorsum of her right wrist which is of the nevoid  Subtype.  Was concerned that the margin was deeper than this. He discussed the treatment of melanomas. We discussed sentinel lymph node mappingfor the treatment of melanoma.  We discussed the potential complications of the procedure. Discussed the rationale for lymph node mapping and observation of the lymph node basin. We discussed potential cosmetic and functional issues given the location of the scar from the previous biopsy which was on the dorsum of her right wrist. They could be range of motion issues, nerve issues, and other problems that may need to be addressed later time depending on her function. We discussed lymphedema with sentinel lymph node mapping. We discussed the use of dye for this procedure. Sentinel lymph node mapping and dissection has been discussed with the patient.  Risk of bleeding,  Infection,  Seroma formation,  Additional procedures,,  Shoulder weakness ,  Shoulder stiffness,  Nerve and blood vessel injury and reaction to the mapping dyes have been discussed.  Alternatives to surgery have been discussed with the patient.  The patient agrees to proceed.The procedure has been discussed with the patient.  Alternative therapies have been  discussed with the patient.  Operative risks include bleeding,  Infection,  Organ injury,  Nerve injury,  Blood vessel injury,  DVT,  Pulmonary embolism,  Death,  And possible reoperation.  Medical management risks include worsening of present situation.  The success of the procedure is 50 -90 % at treating patients symptoms.  The patient understands and agrees to proceed.     Description of procedure: The patient was met in the holding area. The previous area where the melanoma was excised by dermatology was identified on the dorsum of the right  wrist. This was injected by nuclear medicine. There is no gross melanoma left behind and  scar was quite small. I marked this with assistance  of patient and  husband and by reviewing my notes. The area was circled a pen.  Risks benefits and potential other surgeries and/or procedures discussed. She was taken back to the operating room. She was placed supine  upon the OR table. After induction of general anesthesia, the right arm was placed on right arm board. We Prepped the arm to include the hand and arm up into the axilla. Timeout was done. Previous area of excision with melanoma was identified and is marked  with a marker. Curvlinear marks  were drawn around this for 1 cm margin. . 4 mL of methylene blue dye were injected  Into subcutaneous space and  massaged for 5 minutes. Local anesthetic was infiltrated. Curvilinear incision was made above and below the previous scar. This was transverse since  A vertical incision would be very difficult to close without tension given the orientation of the lesion. This was taken down to the fascia of the  forearm musculature but not beyond. Hemostasis was  achieved  And specimen removed. I was able to close incision with 2-0 Vicryl and  4 O  Monocryl. I tested the range of motion. Good  RAGE OF MOTION with minimal stress on the  suture line.   Neoprobe was used on the axilla to  identify the right  Axilla sentenal lymph  node.  A 3 cm incision was made IN THE RIGHT AXILLA.  Dissection was carried down into the deep right axillary lymph node basin. 2 level I deep sentinel nodes were identified and excised. These are hot  but not blue. Background counts approached 0. There is no other blue lymph nodes identified in the  axilla. Hemostasis was achieved Surgicel snow was placed. Wound was closed with 3-0 Vicryl for Monocryl. All final counts are found to be correct. Patient was awoke extubated and  Taken to recovery in satisfactory condition.

## 2016-12-16 ENCOUNTER — Encounter (HOSPITAL_BASED_OUTPATIENT_CLINIC_OR_DEPARTMENT_OTHER): Payer: Self-pay | Admitting: Surgery

## 2016-12-27 ENCOUNTER — Telehealth: Payer: Self-pay | Admitting: General Surgery

## 2016-12-27 NOTE — Telephone Encounter (Signed)
Patient's husband called in stating the patient developed a fever of 103.9 took Tylenol and Advil and fever still around 103. Patient has chills, body aches and just recently had nausea and vomiting. Patient is 2 weeks out from wide local excision of wrist melanoma with sentinel lymph node biopsy of axilla. There was a question of either infection versus dermatitis at her incision when seen in the urgent office 2 days ago. Advised patient husband that she needed to be brought to the emergency room for further evaluation given the high fever, aches, and nausea vomiting. He voiced understanding  Leighton Ruff. Redmond Pulling, MD, FACS General, Bariatric, & Minimally Invasive Surgery [facility], [place]

## 2016-12-28 ENCOUNTER — Observation Stay (HOSPITAL_COMMUNITY)
Admission: EM | Admit: 2016-12-28 | Discharge: 2016-12-29 | Disposition: A | Discharge status: Home / Self Care | Payer: 59 | Attending: Surgery | Admitting: Surgery

## 2016-12-28 ENCOUNTER — Encounter (HOSPITAL_COMMUNITY): Payer: Self-pay | Admitting: Emergency Medicine

## 2016-12-28 ENCOUNTER — Emergency Department (HOSPITAL_COMMUNITY): Payer: 59 | Admitting: Anesthesiology

## 2016-12-28 ENCOUNTER — Encounter (HOSPITAL_COMMUNITY)
Admission: EM | Disposition: A | Discharge status: Home / Self Care | Payer: Self-pay | Source: Home / Self Care | Attending: Emergency Medicine

## 2016-12-28 ENCOUNTER — Emergency Department (HOSPITAL_COMMUNITY): Payer: 59

## 2016-12-28 DIAGNOSIS — Z79899 Other long term (current) drug therapy: Secondary | ICD-10-CM | POA: Diagnosis not present

## 2016-12-28 DIAGNOSIS — Z85820 Personal history of malignant melanoma of skin: Secondary | ICD-10-CM | POA: Diagnosis not present

## 2016-12-28 DIAGNOSIS — T814XXA Infection following a procedure, initial encounter: Secondary | ICD-10-CM | POA: Diagnosis not present

## 2016-12-28 DIAGNOSIS — L02419 Cutaneous abscess of limb, unspecified: Secondary | ICD-10-CM | POA: Diagnosis present

## 2016-12-28 DIAGNOSIS — I10 Essential (primary) hypertension: Secondary | ICD-10-CM | POA: Diagnosis not present

## 2016-12-28 DIAGNOSIS — E860 Dehydration: Secondary | ICD-10-CM | POA: Diagnosis not present

## 2016-12-28 DIAGNOSIS — K219 Gastro-esophageal reflux disease without esophagitis: Secondary | ICD-10-CM | POA: Diagnosis not present

## 2016-12-28 DIAGNOSIS — R000 Tachycardia, unspecified: Secondary | ICD-10-CM | POA: Diagnosis not present

## 2016-12-28 DIAGNOSIS — L02411 Cutaneous abscess of right axilla: Secondary | ICD-10-CM | POA: Insufficient documentation

## 2016-12-28 DIAGNOSIS — R590 Localized enlarged lymph nodes: Secondary | ICD-10-CM | POA: Diagnosis not present

## 2016-12-28 DIAGNOSIS — T8140XA Infection following a procedure, unspecified, initial encounter: Secondary | ICD-10-CM

## 2016-12-28 DIAGNOSIS — L03111 Cellulitis of right axilla: Secondary | ICD-10-CM | POA: Diagnosis not present

## 2016-12-28 DIAGNOSIS — I110 Hypertensive heart disease with heart failure: Secondary | ICD-10-CM | POA: Insufficient documentation

## 2016-12-28 DIAGNOSIS — L03113 Cellulitis of right upper limb: Secondary | ICD-10-CM | POA: Diagnosis not present

## 2016-12-28 DIAGNOSIS — Z6835 Body mass index (BMI) 35.0-35.9, adult: Secondary | ICD-10-CM | POA: Insufficient documentation

## 2016-12-28 DIAGNOSIS — R509 Fever, unspecified: Secondary | ICD-10-CM | POA: Diagnosis not present

## 2016-12-28 DIAGNOSIS — Z881 Allergy status to other antibiotic agents status: Secondary | ICD-10-CM | POA: Diagnosis not present

## 2016-12-28 DIAGNOSIS — Z888 Allergy status to other drugs, medicaments and biological substances status: Secondary | ICD-10-CM | POA: Insufficient documentation

## 2016-12-28 HISTORY — PX: IRRIGATION AND DEBRIDEMENT ABSCESS: SHX5252

## 2016-12-28 LAB — COMPREHENSIVE METABOLIC PANEL
ALBUMIN: 3.4 g/dL — AB (ref 3.5–5.0)
ALT: 22 U/L (ref 14–54)
AST: 23 U/L (ref 15–41)
Alkaline Phosphatase: 76 U/L (ref 38–126)
Anion gap: 9 (ref 5–15)
BUN: 17 mg/dL (ref 6–20)
CHLORIDE: 102 mmol/L (ref 101–111)
CO2: 27 mmol/L (ref 22–32)
CREATININE: 1 mg/dL (ref 0.44–1.00)
Calcium: 9 mg/dL (ref 8.9–10.3)
GFR calc non Af Amer: >60 mL/min (ref >60)
GLUCOSE: 117 mg/dL — AB (ref 65–99)
Potassium: 3.1 mmol/L — ABNORMAL LOW (ref 3.5–5.1)
SODIUM: 138 mmol/L (ref 135–145)
Total Bilirubin: 0.7 mg/dL (ref 0.3–1.2)
Total Protein: 7.1 g/dL (ref 6.5–8.1)

## 2016-12-28 LAB — CBC WITH DIFFERENTIAL/PLATELET
BASOS ABS: 0 10*3/uL (ref 0.0–0.1)
Basophils Relative: 0 %
EOS ABS: 0 10*3/uL (ref 0.0–0.7)
Eosinophils Relative: 0 %
HEMATOCRIT: 37.2 % (ref 36.0–46.0)
HEMOGLOBIN: 12.6 g/dL (ref 12.0–15.0)
LYMPHS PCT: 7 %
Lymphs Abs: 2.1 10*3/uL (ref 0.7–4.0)
MCH: 29.9 pg (ref 26.0–34.0)
MCHC: 33.9 g/dL (ref 30.0–36.0)
MCV: 88.2 fL (ref 78.0–100.0)
MONOS PCT: 3 %
Monocytes Absolute: 0.9 10*3/uL (ref 0.1–1.0)
NEUTROS ABS: 26.3 10*3/uL — AB (ref 1.7–7.7)
NEUTROS PCT: 90 %
Platelets: 268 10*3/uL (ref 150–400)
RBC: 4.22 MIL/uL (ref 3.87–5.11)
RDW: 13.6 % (ref 11.5–15.5)
WBC: 29.3 10*3/uL — AB (ref 4.0–10.5)

## 2016-12-28 LAB — PROTIME-INR
INR: 1.09
PROTHROMBIN TIME: 14.2 s (ref 11.4–15.2)

## 2016-12-28 LAB — URINALYSIS, ROUTINE W REFLEX MICROSCOPIC
BILIRUBIN URINE: NEGATIVE
Glucose, UA: NEGATIVE mg/dL
HGB URINE DIPSTICK: NEGATIVE
Ketones, ur: NEGATIVE mg/dL
LEUKOCYTES UA: NEGATIVE
Nitrite: NEGATIVE
PROTEIN: 30 mg/dL — AB
RBC / HPF: NONE SEEN RBC/hpf (ref 0–5)
Specific Gravity, Urine: 1.027 (ref 1.005–1.030)
WBC, UA: NONE SEEN WBC/hpf (ref 0–5)
pH: 5 (ref 5.0–8.0)

## 2016-12-28 LAB — I-STAT CG4 LACTIC ACID, ED: Lactic Acid, Venous: 1.78 mmol/L (ref 0.5–1.9)

## 2016-12-28 SURGERY — IRRIGATION AND DEBRIDEMENT ABSCESS
Anesthesia: General | Site: Axilla | Laterality: Right

## 2016-12-28 MED ORDER — TRAMADOL HCL 50 MG PO TABS: 50.0000 mg | ORAL_TABLET | Freq: Four times a day (QID) | ORAL | Status: DC | PRN

## 2016-12-28 MED ORDER — ONDANSETRON HCL 4 MG/2ML IJ SOLN
INTRAMUSCULAR | Status: AC
  Filled 2016-12-28: qty 2

## 2016-12-28 MED ORDER — ACETAMINOPHEN 500 MG PO TABS
1000.0000 mg | ORAL_TABLET | Freq: Four times a day (QID) | ORAL | Status: DC
  Administered 2016-12-28 – 2016-12-29 (×2): 1000 mg via ORAL
  Filled 2016-12-28 (×3): qty 2

## 2016-12-28 MED ORDER — SUCCINYLCHOLINE CHLORIDE 200 MG/10ML IV SOSY
PREFILLED_SYRINGE | INTRAVENOUS | Status: AC
  Filled 2016-12-28: qty 10

## 2016-12-28 MED ORDER — VANCOMYCIN HCL IN DEXTROSE 1-5 GM/200ML-% IV SOLN
1000.0000 mg | Freq: Once | INTRAVENOUS | Status: AC
  Administered 2016-12-28: 1000 mg via INTRAVENOUS
  Filled 2016-12-28: qty 200

## 2016-12-28 MED ORDER — ONDANSETRON 4 MG PO TBDP: 4.0000 mg | ORAL_TABLET | Freq: Four times a day (QID) | ORAL | Status: DC | PRN

## 2016-12-28 MED ORDER — BUPIVACAINE-EPINEPHRINE 0.25% -1:200000 IJ SOLN
INTRAMUSCULAR | Status: AC
  Filled 2016-12-28: qty 1

## 2016-12-28 MED ORDER — FENTANYL CITRATE (PF) 100 MCG/2ML IJ SOLN
INTRAMUSCULAR | Status: DC | PRN
  Administered 2016-12-28: 25 ug via INTRAVENOUS
  Administered 2016-12-28: 50 ug via INTRAVENOUS
  Administered 2016-12-28: 25 ug via INTRAVENOUS

## 2016-12-28 MED ORDER — PIPERACILLIN-TAZOBACTAM 3.375 G IVPB 30 MIN
3.3750 g | Freq: Once | INTRAVENOUS | Status: AC
  Administered 2016-12-28: 3.375 g via INTRAVENOUS
  Filled 2016-12-28: qty 50

## 2016-12-28 MED ORDER — LIDOCAINE HCL (PF) 1 % IJ SOLN
INTRAMUSCULAR | Status: AC
  Filled 2016-12-28: qty 5

## 2016-12-28 MED ORDER — LOSARTAN POTASSIUM 50 MG PO TABS
25.0000 mg | ORAL_TABLET | Freq: Two times a day (BID) | ORAL | Status: DC
  Administered 2016-12-28 – 2016-12-29 (×2): 25 mg via ORAL
  Filled 2016-12-28 (×2): qty 1

## 2016-12-28 MED ORDER — MORPHINE SULFATE (PF) 4 MG/ML IV SOLN: 2.0000 mg | INTRAVENOUS | Status: DC | PRN

## 2016-12-28 MED ORDER — PROPOFOL 500 MG/50ML IV EMUL
INTRAVENOUS | Status: DC | PRN
  Administered 2016-12-28: 100 ug/kg/min via INTRAVENOUS

## 2016-12-28 MED ORDER — DEXAMETHASONE SODIUM PHOSPHATE 10 MG/ML IJ SOLN
INTRAMUSCULAR | Status: AC
  Filled 2016-12-28: qty 1

## 2016-12-28 MED ORDER — ONDANSETRON HCL 4 MG/2ML IJ SOLN
INTRAMUSCULAR | Status: DC | PRN
  Administered 2016-12-28: 4 mg via INTRAVENOUS

## 2016-12-28 MED ORDER — FENTANYL CITRATE (PF) 250 MCG/5ML IJ SOLN
INTRAMUSCULAR | Status: AC
  Filled 2016-12-28: qty 5

## 2016-12-28 MED ORDER — PROPOFOL 10 MG/ML IV BOLUS
INTRAVENOUS | Status: DC | PRN
  Administered 2016-12-28: 150 mg via INTRAVENOUS
  Administered 2016-12-28 (×2): 25 mg via INTRAVENOUS

## 2016-12-28 MED ORDER — DIPHENHYDRAMINE HCL 50 MG/ML IJ SOLN: 12.5000 mg | Freq: Four times a day (QID) | INTRAMUSCULAR | Status: DC | PRN

## 2016-12-28 MED ORDER — MIDAZOLAM HCL 2 MG/2ML IJ SOLN
INTRAMUSCULAR | Status: AC
  Filled 2016-12-28: qty 2

## 2016-12-28 MED ORDER — ONDANSETRON HCL 4 MG/2ML IJ SOLN
4.0000 mg | Freq: Four times a day (QID) | INTRAMUSCULAR | Status: DC | PRN
Start: 2016-12-28 — End: 2016-12-29

## 2016-12-28 MED ORDER — OXYCODONE HCL 5 MG PO TABS: 5.0000 mg | ORAL_TABLET | ORAL | Status: DC | PRN

## 2016-12-28 MED ORDER — LACTATED RINGERS IV SOLN
INTRAVENOUS | Status: DC | PRN
  Administered 2016-12-28: 18:00:00 via INTRAVENOUS

## 2016-12-28 MED ORDER — MIDAZOLAM HCL 5 MG/5ML IJ SOLN
INTRAMUSCULAR | Status: DC | PRN
  Administered 2016-12-28: 2 mg via INTRAVENOUS

## 2016-12-28 MED ORDER — DIPHENHYDRAMINE HCL 12.5 MG/5ML PO ELIX: 12.5000 mg | ORAL_SOLUTION | Freq: Four times a day (QID) | ORAL | Status: DC | PRN

## 2016-12-28 MED ORDER — 0.9 % SODIUM CHLORIDE (POUR BTL) OPTIME
TOPICAL | Status: DC | PRN
  Administered 2016-12-28: 1000 mL

## 2016-12-28 MED ORDER — DEXAMETHASONE SODIUM PHOSPHATE 10 MG/ML IJ SOLN
INTRAMUSCULAR | Status: DC | PRN
  Administered 2016-12-28: 10 mg via INTRAVENOUS

## 2016-12-28 MED ORDER — LIDOCAINE HCL 1 % IJ SOLN
5.0000 mL | Freq: Once | INTRAMUSCULAR | Status: AC
  Administered 2016-12-28: 5 mL

## 2016-12-28 MED ORDER — LIDOCAINE 2% (20 MG/ML) 5 ML SYRINGE
INTRAMUSCULAR | Status: AC
  Filled 2016-12-28: qty 5

## 2016-12-28 MED ORDER — PIPERACILLIN-TAZOBACTAM 3.375 G IVPB
3.3750 g | Freq: Three times a day (TID) | INTRAVENOUS | Status: DC
  Administered 2016-12-28 – 2016-12-29 (×2): 3.375 g via INTRAVENOUS
  Filled 2016-12-28 (×4): qty 50

## 2016-12-28 MED ORDER — BUPIVACAINE-EPINEPHRINE (PF) 0.25% -1:200000 IJ SOLN
INTRAMUSCULAR | Status: DC | PRN
  Administered 2016-12-28: 20 mL via PERINEURAL

## 2016-12-28 MED ORDER — HYDROMORPHONE HCL 1 MG/ML IJ SOLN: 0.2500 mg | INTRAMUSCULAR | Status: DC | PRN

## 2016-12-28 MED ORDER — ENOXAPARIN SODIUM 40 MG/0.4ML ~~LOC~~ SOLN
40.0000 mg | SUBCUTANEOUS | Status: DC
  Administered 2016-12-29: 40 mg via SUBCUTANEOUS
  Filled 2016-12-28: qty 0.4

## 2016-12-28 MED ORDER — DIPHENHYDRAMINE HCL 50 MG/ML IJ SOLN
25.0000 mg | Freq: Once | INTRAMUSCULAR | Status: AC
  Administered 2016-12-28: 25 mg via INTRAVENOUS
  Filled 2016-12-28: qty 1

## 2016-12-28 MED ORDER — POTASSIUM CHLORIDE IN NACL 20-0.9 MEQ/L-% IV SOLN
INTRAVENOUS | Status: DC
  Administered 2016-12-28: 1 mL via INTRAVENOUS
  Filled 2016-12-28: qty 1000

## 2016-12-28 MED ORDER — PANTOPRAZOLE SODIUM 40 MG PO TBEC
40.0000 mg | DELAYED_RELEASE_TABLET | Freq: Every day | ORAL | Status: DC
  Administered 2016-12-29: 40 mg via ORAL
  Filled 2016-12-28: qty 1

## 2016-12-28 MED ORDER — SODIUM CHLORIDE 0.9 % IV BOLUS (SEPSIS)
1000.0000 mL | Freq: Once | INTRAVENOUS | Status: AC
  Administered 2016-12-28: 1000 mL via INTRAVENOUS

## 2016-12-28 MED ORDER — SUCCINYLCHOLINE CHLORIDE 20 MG/ML IJ SOLN
INTRAMUSCULAR | Status: DC | PRN
  Administered 2016-12-28: 100 mg via INTRAVENOUS

## 2016-12-28 MED ORDER — PROPOFOL 10 MG/ML IV BOLUS
INTRAVENOUS | Status: AC
  Filled 2016-12-28: qty 20

## 2016-12-28 MED ORDER — IBUPROFEN 800 MG PO TABS
800.0000 mg | ORAL_TABLET | Freq: Once | ORAL | Status: AC
  Administered 2016-12-28: 800 mg via ORAL
  Filled 2016-12-28: qty 1

## 2016-12-28 SURGICAL SUPPLY — 29 items
BLADE CLIPPER SURG (BLADE) IMPLANT
BNDG GAUZE ELAST 4 BULKY (GAUZE/BANDAGES/DRESSINGS) ×1 IMPLANT
CANISTER SUCT 3000ML PPV (MISCELLANEOUS) ×2 IMPLANT
COVER SURGICAL LIGHT HANDLE (MISCELLANEOUS) ×2 IMPLANT
DRAPE UTILITY XL STRL (DRAPES) IMPLANT
DRSG PAD ABDOMINAL 8X10 ST (GAUZE/BANDAGES/DRESSINGS) IMPLANT
ELECT CAUTERY BLADE 6.4 (BLADE) ×2 IMPLANT
ELECT REM PT RETURN 9FT ADLT (ELECTROSURGICAL) ×2
ELECTRODE REM PT RTRN 9FT ADLT (ELECTROSURGICAL) ×1 IMPLANT
GAUZE SPONGE 4X4 12PLY STRL (GAUZE/BANDAGES/DRESSINGS) ×1 IMPLANT
GLOVE BIO SURGEON STRL SZ7 (GLOVE) ×2 IMPLANT
GLOVE BIOGEL PI IND STRL 7.5 (GLOVE) ×1 IMPLANT
GLOVE BIOGEL PI INDICATOR 7.5 (GLOVE) ×1
GOWN STRL REUS W/ TWL LRG LVL3 (GOWN DISPOSABLE) ×2 IMPLANT
GOWN STRL REUS W/TWL LRG LVL3 (GOWN DISPOSABLE) ×4
KIT BASIN OR (CUSTOM PROCEDURE TRAY) ×2 IMPLANT
KIT ROOM TURNOVER OR (KITS) ×2 IMPLANT
NS IRRIG 1000ML POUR BTL (IV SOLUTION) ×2 IMPLANT
PACK SURGICAL SETUP 50X90 (CUSTOM PROCEDURE TRAY) ×2 IMPLANT
PAD ARMBOARD 7.5X6 YLW CONV (MISCELLANEOUS) ×2 IMPLANT
PENCIL BUTTON HOLSTER BLD 10FT (ELECTRODE) ×2 IMPLANT
SPONGE LAP 18X18 X RAY DECT (DISPOSABLE) ×2 IMPLANT
SWAB COLLECTION DEVICE MRSA (MISCELLANEOUS) IMPLANT
SWAB CULTURE ESWAB REG 1ML (MISCELLANEOUS) IMPLANT
SYR BULB IRRIGATION 50ML (SYRINGE) ×2 IMPLANT
TOWEL OR 17X24 6PK STRL BLUE (TOWEL DISPOSABLE) ×2 IMPLANT
TOWEL OR 17X26 10 PK STRL BLUE (TOWEL DISPOSABLE) ×2 IMPLANT
TUBE CONNECTING 12X1/4 (SUCTIONS) ×2 IMPLANT
YANKAUER SUCT BULB TIP NO VENT (SUCTIONS) ×2 IMPLANT

## 2016-12-28 NOTE — Anesthesia Procedure Notes (Signed)
Procedure Name: Intubation Date/Time: 12/28/2016 6:05 PM Performed by: Eligha Bridegroom Pre-anesthesia Checklist: Suction available, Emergency Drugs available, Patient being monitored and Timeout performed Patient Re-evaluated:Patient Re-evaluated prior to induction Oxygen Delivery Method: Circle system utilized Preoxygenation: Pre-oxygenation with 100% oxygen Induction Type: IV induction Laryngoscope Size: Mac Grade View: Grade II Tube type: Oral Tube size: 7.0 mm Number of attempts: 1 Airway Equipment and Method: Stylet Placement Confirmation: ETT inserted through vocal cords under direct vision,  positive ETCO2 and breath sounds checked- equal and bilateral Secured at: 21 cm Tube secured with: Tape Dental Injury: Teeth and Oropharynx as per pre-operative assessment

## 2016-12-28 NOTE — Anesthesia Preprocedure Evaluation (Addendum)
Anesthesia Evaluation  Patient identified by MRN, date of birth, ID band Patient awake    Reviewed: Allergy & Precautions, H&P , NPO status , Patient's Chart, lab work & pertinent test results  History of Anesthesia Complications (+) PONV  Airway Mallampati: III  TM Distance: >3 FB Neck ROM: Full    Dental no notable dental hx. (+) Teeth Intact, Dental Advisory Given   Pulmonary neg pulmonary ROS,    Pulmonary exam normal breath sounds clear to auscultation       Cardiovascular hypertension, Pt. on medications  Rhythm:Regular Rate:Normal     Neuro/Psych negative neurological ROS  negative psych ROS   GI/Hepatic Neg liver ROS, GERD  Medicated and Controlled,  Endo/Other  Morbid obesity  Renal/GU negative Renal ROS  negative genitourinary   Musculoskeletal   Abdominal   Peds  Hematology negative hematology ROS (+)   Anesthesia Other Findings   Reproductive/Obstetrics negative OB ROS                            Anesthesia Physical Anesthesia Plan  ASA: III  Anesthesia Plan: General   Post-op Pain Management:    Induction: Intravenous  PONV Risk Score and Plan: 4 or greater and Ondansetron, Dexamethasone, Midazolam and Propofol infusion  Airway Management Planned: Oral ETT  Additional Equipment:   Intra-op Plan:   Post-operative Plan: Extubation in OR  Informed Consent: I have reviewed the patients History and Physical, chart, labs and discussed the procedure including the risks, benefits and alternatives for the proposed anesthesia with the patient or authorized representative who has indicated his/her understanding and acceptance.   Dental advisory given  Plan Discussed with: CRNA  Anesthesia Plan Comments:        Anesthesia Quick Evaluation

## 2016-12-28 NOTE — Anesthesia Postprocedure Evaluation (Signed)
Anesthesia Post Note  Patient: Kamiyah Kindel  Procedure(s) Performed: Procedure(s) (LRB): IRRIGATION AND DEBRIDEMENT AXILLARY ABSCESS (Right)     Patient location during evaluation: PACU Anesthesia Type: General Level of consciousness: awake and alert Pain management: pain level controlled Vital Signs Assessment: post-procedure vital signs reviewed and stable Respiratory status: spontaneous breathing, nonlabored ventilation and respiratory function stable Cardiovascular status: blood pressure returned to baseline and stable Postop Assessment: no signs of nausea or vomiting Anesthetic complications: no    Last Vitals:  Vitals:   12/28/16 1930 12/28/16 1939  BP:  139/74  Pulse:  83  Resp:  19  Temp: 36.7 C     Last Pain:  Vitals:   12/28/16 1940  TempSrc:   PainSc: 3                  Shanikia Kernodle,W. EDMOND

## 2016-12-28 NOTE — ED Triage Notes (Signed)
Pt reported a rash at needle  Site that Vanco.. Multiple blotchy red sites

## 2016-12-28 NOTE — ED Provider Notes (Signed)
[facility] DEPT Provider Note   CSN: [id number] Arrival date & time: 12/28/16  2094     History   Chief Complaint Chief Complaint  Patient presents with  . Fever  . Post-op Problem    HPI Samantha Castro is a 46 y.o. female history of hypertension, melanoma here presenting with fever, possible postop infection. 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 Brantley Stage. She had a postop visit about 4 days ago in the clinic and was thought to have early cellulitis versus abscess. She declined I&D at that time and was told to follow-up up in a week. No abx were started at that time. Patient States that for the last 3 days she's been running low-grade temperature about 99 and then started running fever 102-103 F yesterday. Patient also noticed that there is increased swelling of the surgical site. Denies any cough or urinary symptoms. She called the surgeon was sent in for evaluation.   The history is provided by the patient.    Past Medical History:  Diagnosis Date  . Allergic rhinitis   . Cancer ([place])    melanoma  . Endometriosis   . GERD (gastroesophageal reflux disease)   . Hypertension   . PONV (postoperative nausea and vomiting)     There are no active problems to display for this patient.   Past Surgical History:  Procedure Laterality Date  . ABDOMINAL HYSTERECTOMY    . APPENDECTOMY    . ARTHROSCOPIC REPAIR ACL    . COLON RESECTION    . COLON SURGERY    . EXCISION MELANOMA WITH SENTINEL LYMPH NODE BIOPSY Right 12/15/2016   Procedure: WIDE EXCISION RIGHT WRIST  MELANOMA WITH RIGHT SENTINEL NODE MAPPING;  Surgeon: Erroll Luna, MD;  Location: [facility];  Service: General;  Laterality: Right;  . MELANOMA EXCISION    . PELVIC LAPAROSCOPY      OB History    No data available       Home Medications    Prior to Admission medications   Medication Sig Start Date End Date Taking? Authorizing  Provider  ibuprofen (ADVIL,MOTRIN) 800 MG tablet Take 1 tablet (800 mg total) by mouth every 8 (eight) hours as needed. Patient taking differently: Take 800 mg by mouth every 8 (eight) hours as needed for headache or moderate pain.  12/15/16  Yes Cornett, Marcello Moores, MD  losartan (COZAAR) 25 MG tablet Take 25 mg by mouth 2 (two) times daily.    Yes [provider]  ondansetron (ZOFRAN ODT) 8 MG disintegrating tablet Take 1 tablet (8 mg total) by mouth every 8 (eight) hours as needed for nausea or vomiting. 12/15/16  Yes Cornett, Marcello Moores, MD  pantoprazole (PROTONIX) 40 MG tablet Take 40 mg by mouth daily.   Yes [provider]  oxyCODONE (OXY IR/ROXICODONE) 5 MG immediate release tablet Take 1-2 tablets (5-10 mg total) by mouth every 6 (six) hours as needed for severe pain. Patient not taking: Reported on 12/28/2016 12/15/16   Erroll Luna, MD    Family History Family History  Problem Relation Age of Onset  . Lung cancer Mother   . Hypertension Mother   . Colon cancer Mother   . Lung cancer Father   . Heart disease Father   . Hypertension Father     Social History Social History  Substance Use Topics  . Smoking status: Never Smoker  . Smokeless tobacco: Never Used  . Alcohol use  No     Allergies   Bacitracin-polymyxin b and Lisinopril   Review of Systems Review of Systems  Constitutional: Positive for fever.  All other systems reviewed and are negative.    Physical Exam Updated Vital Signs BP (!) 165/95 (BP Location: Left Arm)   Pulse (!) 105   Temp 99.7 F (37.6 C) (Oral)   Resp 18   Ht 5\' 4"  (1.626 m)   Wt 94.3 kg (208 lb)   SpO2 97%   BMI 35.70 kg/m   Physical Exam  Constitutional:  Uncomfortable   HENT:  Head: Normocephalic.  MM slightly  Dry   Eyes: Pupils are equal, round, and reactive to light. Conjunctivae and EOM are normal.  Neck: Normal range of motion. Neck supple.  Cardiovascular: Regular rhythm and normal heart sounds.   Slightly  tachy   Pulmonary/Chest: Effort normal and breath sounds normal. No respiratory distress. She has no wheezes.  Abdominal: Soft. Bowel sounds are normal. She exhibits no distension. There is no tenderness.  Musculoskeletal: Normal range of motion.  R wrist wound healing well with no signs of infection. R axillary wound site with redness, ? Fluctuance. + obvious cellulitis   Neurological: She is alert.  Skin: Skin is warm.  Psychiatric: She has a normal mood and affect.  Nursing note and vitals reviewed.    ED Treatments / Results  Labs (all labs ordered are listed, but only abnormal results are displayed) Labs Reviewed  COMPREHENSIVE METABOLIC PANEL - Abnormal; Notable for the following:       Result Value   Potassium 3.1 (*)    Glucose, Bld 117 (*)    Albumin 3.4 (*)    All other components within normal limits  CBC WITH DIFFERENTIAL/PLATELET - Abnormal; Notable for the following:    WBC 29.3 (*)    Neutro Abs 26.3 (*)    All other components within normal limits  CULTURE, BLOOD (ROUTINE X 2)  CULTURE, BLOOD (ROUTINE X 2)  PROTIME-INR  URINALYSIS, ROUTINE W REFLEX MICROSCOPIC  I-STAT CG4 LACTIC ACID, ED    EKG  EKG Interpretation None       Radiology Dg Chest 2 View  Result Date: 12/28/2016 CLINICAL DATA:  Fever EXAM: CHEST  2 VIEW COMPARISON:  None. FINDINGS: Heart and mediastinal contours are within normal limits. No focal opacities or effusions. No acute bony abnormality. IMPRESSION: No active cardiopulmonary disease. Electronically Signed   By: Rolm Baptise M.D.   On: 12/28/2016 10:45   Dg Forearm Right  Result Date: 12/28/2016 CLINICAL DATA:  Right forearm cellulitis EXAM: RIGHT FOREARM - 2 VIEW COMPARISON:  None. FINDINGS: There is no evidence of fracture or other focal bone lesions. Soft tissues are unremarkable. IMPRESSION: Negative. Electronically Signed   By: Rolm Baptise M.D.   On: 12/28/2016 10:46    Procedures Procedures (including critical care  time)  Medications Ordered in ED Medications  sodium chloride 0.9 % bolus 1,000 mL (not administered)  vancomycin (VANCOCIN) IVPB 1000 mg/200 mL premix (not administered)  piperacillin-tazobactam (ZOSYN) IVPB 3.375 g (not administered)     Initial Impression / Assessment and Plan / ED Course  I have reviewed the triage vital signs and the nursing notes.  Pertinent labs & imaging results that were available during my care of the patient were reviewed by me and considered in my medical decision making (see chart for details).    Samantha Castro is a 46 y.o. female here with fever, possible wound infection. Tachycardic, febrile  at home. Sepsis workup initiated.   11 am WBC 30. CXR clear. I ordered vanc/zosyn. I called surgery to see patient.   1 pm Surgery at bedside. Attempted I and D but nothing came out. Ordered soft tissue US. He wants me to call him after [place] is done. Given zosyn, possible allergic reaction to vanc so it was stopped. Given benadryl and hives improved. I discussed IV abx and admission vs close follow up tomorrow with Dr. Brantley Stage and she prefers to go home if US showed no deep tissue abscess.   2:48 PM US showed 5 cm x 4 cm x 3 cm abscess. I talked to Dr. Gershon Crane again. He will review imaging and take patient to the OR for I and D under sedation.    Final Clinical Impressions(s) / ED Diagnoses   Final diagnoses:  None    New Prescriptions New Prescriptions   No medications on file     Drenda Freeze, MD 12/28/16 1511

## 2016-12-28 NOTE — ED Triage Notes (Signed)
Pt. Stated, I had some places removed from my body 2 weeks ago and fever with body aches stared yesterday. Pt. Concerned about one of the areas that were removed was infected and went to the doctor on Wednesday. She did not choose to have it drained. Fever went to 103.  Area removed was rt. Wrist and right lymph .

## 2016-12-28 NOTE — H&P (Signed)
Samantha Castro is an 46 y.o. female.   Chief Complaint: Right axilla fullness and pain, fever HPI: This is a 46 yo female who is two weeks s/p wide excision of melanoma of the right wrist along with right axillary sentinel lymph node biopsy by Dr. Brantley Stage.  Last week, she developed some blisters and erythema around the right wrist incision, as well as the right axilla incision.  This was felt to represent some dermatitis from the Dermabond.  The right wrist incision has improved but the axilla feels more full and is more tender.  She had fever up to 103 yesterday.  There is some mild erythema at the axillary incision.    Past Medical History:  Diagnosis Date  . Allergic rhinitis   . Cancer ([place])    melanoma  . Endometriosis   . GERD (gastroesophageal reflux disease)   . Hypertension   . PONV (postoperative nausea and vomiting)     Past Surgical History:  Procedure Laterality Date  . ABDOMINAL HYSTERECTOMY    . APPENDECTOMY    . ARTHROSCOPIC REPAIR ACL    . COLON RESECTION    . COLON SURGERY    . EXCISION MELANOMA WITH SENTINEL LYMPH NODE BIOPSY Right 12/15/2016   Procedure: WIDE EXCISION RIGHT WRIST  MELANOMA WITH RIGHT SENTINEL NODE MAPPING;  Surgeon: Erroll Luna, MD;  Location: [facility];  Service: General;  Laterality: Right;  . MELANOMA EXCISION    . PELVIC LAPAROSCOPY      Family History  Problem Relation Age of Onset  . Lung cancer Mother   . Hypertension Mother   . Colon cancer Mother   . Lung cancer Father   . Heart disease Father   . Hypertension Father    Social History:  reports that she has never smoked. She has never used smokeless tobacco. She reports that she does not drink alcohol or use drugs.  Allergies:  Allergies  Allergen Reactions  . Bacitracin-Polymyxin B Rash  . Lisinopril Other (See Comments)    Unknown    Prior to Admission medications   Medication Sig Start Date End Date Taking? Authorizing Provider  ibuprofen  (ADVIL,MOTRIN) 800 MG tablet Take 1 tablet (800 mg total) by mouth every 8 (eight) hours as needed. Patient taking differently: Take 800 mg by mouth every 8 (eight) hours as needed for headache or moderate pain.  12/15/16  Yes Cornett, Marcello Moores, MD  losartan (COZAAR) 25 MG tablet Take 25 mg by mouth 2 (two) times daily.    Yes [provider]  ondansetron (ZOFRAN ODT) 8 MG disintegrating tablet Take 1 tablet (8 mg total) by mouth every 8 (eight) hours as needed for nausea or vomiting. 12/15/16  Yes Cornett, Marcello Moores, MD  pantoprazole (PROTONIX) 40 MG tablet Take 40 mg by mouth daily.   Yes [provider]  oxyCODONE (OXY IR/ROXICODONE) 5 MG immediate release tablet Take 1-2 tablets (5-10 mg total) by mouth every 6 (six) hours as needed for severe pain. Patient not taking: Reported on 12/28/2016 12/15/16   Erroll Luna, MD     Results for orders placed or performed during the hospital encounter of 12/28/16 (from the past 48 hour(s))  Comprehensive metabolic panel     Status: Abnormal   Collection Time: 12/28/16  9:35 AM  Result Value Ref Range   Sodium 138 135 - 145 mmol/L   Potassium 3.1 (L) 3.5 - 5.1 mmol/L   Chloride 102 101 - 111 mmol/L   CO2 27 22 -  32 mmol/L   Glucose, Bld 117 (H) 65 - 99 mg/dL   BUN 17 6 - 20 mg/dL   Creatinine, Ser 1.00 0.44 - 1.00 mg/dL   Calcium 9.0 8.9 - 10.3 mg/dL   Total Protein 7.1 6.5 - 8.1 g/dL   Albumin 3.4 (L) 3.5 - 5.0 g/dL   AST 23 15 - 41 U/L   ALT 22 14 - 54 U/L   Alkaline Phosphatase 76 38 - 126 U/L   Total Bilirubin 0.7 0.3 - 1.2 mg/dL   GFR calc non Af Amer >60 >60 mL/min   GFR calc Af Amer >60 >60 mL/min    Comment: (NOTE) The eGFR has been calculated using the CKD EPI equation. This calculation has not been validated in all clinical situations. eGFR's persistently <60 mL/min signify possible Chronic Kidney Disease.    Anion gap 9 5 - 15  CBC with Differential     Status: Abnormal   Collection Time: 12/28/16  9:35 AM   Result Value Ref Range   WBC 29.3 (H) 4.0 - 10.5 K/uL   RBC 4.22 3.87 - 5.11 MIL/uL   Hemoglobin 12.6 12.0 - 15.0 g/dL   HCT 37.2 36.0 - 46.0 %   MCV 88.2 78.0 - 100.0 fL   MCH 29.9 26.0 - 34.0 pg   MCHC 33.9 30.0 - 36.0 g/dL   RDW 13.6 11.5 - 15.5 %   Platelets 268 150 - 400 K/uL   Neutrophils Relative % 90 %   Lymphocytes Relative 7 %   Monocytes Relative 3 %   Eosinophils Relative 0 %   Basophils Relative 0 %   Neutro Abs 26.3 (H) 1.7 - 7.7 K/uL   Lymphs Abs 2.1 0.7 - 4.0 K/uL   Monocytes Absolute 0.9 0.1 - 1.0 K/uL   Eosinophils Absolute 0.0 0.0 - 0.7 K/uL   Basophils Absolute 0.0 0.0 - 0.1 K/uL   Smear Review MORPHOLOGY UNREMARKABLE   Protime-INR     Status: None   Collection Time: 12/28/16  9:35 AM  Result Value Ref Range   Prothrombin Time 14.2 11.4 - 15.2 seconds   INR 1.09   I-Stat CG4 Lactic Acid, ED     Status: None   Collection Time: 12/28/16 10:23 AM  Result Value Ref Range   Lactic Acid, Venous 1.78 0.5 - 1.9 mmol/L  Urinalysis, Routine w reflex microscopic     Status: Abnormal   Collection Time: 12/28/16 11:29 AM  Result Value Ref Range   Color, Urine AMBER (A) YELLOW    Comment: BIOCHEMICALS MAY BE AFFECTED BY COLOR   APPearance TURBID (A) CLEAR   Specific Gravity, Urine 1.027 1.005 - 1.030   pH 5.0 5.0 - 8.0   Glucose, UA NEGATIVE NEGATIVE mg/dL   Hgb urine dipstick NEGATIVE NEGATIVE   Bilirubin Urine NEGATIVE NEGATIVE   Ketones, ur NEGATIVE NEGATIVE mg/dL   Protein, ur 30 (A) NEGATIVE mg/dL   Nitrite NEGATIVE NEGATIVE   Leukocytes, UA NEGATIVE NEGATIVE   RBC / HPF NONE SEEN 0 - 5 RBC/hpf   WBC, UA NONE SEEN 0 - 5 WBC/hpf   Bacteria, UA RARE (A) NONE SEEN   Squamous Epithelial / LPF 0-5 (A) NONE SEEN   Mucous PRESENT    Amorphous Crystal PRESENT    Dg Chest 2 View  Result Date: 12/28/2016 CLINICAL DATA:  Fever EXAM: CHEST  2 VIEW COMPARISON:  None. FINDINGS: Heart and mediastinal contours are within normal limits. No focal opacities or  effusions. No acute bony  abnormality. IMPRESSION: No active cardiopulmonary disease. Electronically Signed   By: Rolm Baptise M.D.   On: 12/28/2016 10:45   Dg Forearm Right  Result Date: 12/28/2016 CLINICAL DATA:  Right forearm cellulitis EXAM: RIGHT FOREARM - 2 VIEW COMPARISON:  None. FINDINGS: There is no evidence of fracture or other focal bone lesions. Soft tissues are unremarkable. IMPRESSION: Negative. Electronically Signed   By: Rolm Baptise M.D.   On: 12/28/2016 10:46   CLINICAL DATA:  Melanoma status post right axillary node dissection 12/15/2016. Right axillary tenderness, erythema and fever for 3 days. Leukocytosis. Evaluate for seroma versus deep abscess.  EXAM: ULTRASOUND RIGHT UPPER EXTREMITY LIMITED  TECHNIQUE: Ultrasound examination of the upper extremity soft tissues was performed in the area of clinical concern.  COMPARISON:  None.  FINDINGS: Examination is targeted to the right axilla. There is a complex, septated cystic fluid collection measuring 5.9 x 3.2 x 4.9 cm. No solid components are identified. There is no significant surrounding hypervascularity on limited color Doppler imaging.  IMPRESSION: Septated cystic fluid collection measuring up to 5.9 cm in the right axilla. Sonographic appearance is nonspecific for postoperative seroma versus abscess, and aspiration should be considered.   Electronically Signed   By: Richardean Sale M.D.   On: 12/28/2016 15:00  Review of Systems  Constitutional: Positive for fever. Negative for weight loss.  HENT: Negative for ear discharge, ear pain, hearing loss and tinnitus.   Eyes: Negative for blurred vision, double vision, photophobia and pain.  Respiratory: Negative for cough, sputum production and shortness of breath.   Cardiovascular: Negative for chest pain.  Gastrointestinal: Negative for abdominal pain, nausea and vomiting.  Genitourinary: Negative for dysuria, flank pain, frequency and urgency.   Musculoskeletal: Positive for joint pain. Negative for back pain, falls, myalgias and neck pain.  Neurological: Negative for dizziness, tingling, sensory change, focal weakness, loss of consciousness and headaches.  Endo/Heme/Allergies: Does not bruise/bleed easily.  Psychiatric/Behavioral: Negative for depression, memory loss and substance abuse. The patient is not nervous/anxious.     Blood pressure (!) 164/89, pulse 91, temperature 99.7 F (37.6 C), temperature source Oral, resp. rate 18, height _0  (1.626 m), weight 94.3 kg (208 lb), SpO2 98 %. Physical Exam  Right wrist -transverse incision healing well with some scattered reddish lesions around incision; no drainage  Right axillary incision - the anterior 2 cm is mildly erythematous, but the remainder of the incision looks normal.  Slight fullness and tenderness anteriorly.  Assessment/Plan Right wrist melanoma s/p excision and SLN biopsy, now with apparent right axillary wound infection.  Will proceed to OR for incision and drainage - washout of right axillary incision.  The surgical procedure has been discussed with the patient.  Potential risks, benefits, alternative treatments, and expected outcomes have been explained.  All of the patient's questions at this time have been answered.  The likelihood of reaching the patient's treatment goal is good.  The patient understand the proposed surgical procedure and wishes to proceed.    Maia Petties., MD 12/28/2016, 12:37 PM

## 2016-12-28 NOTE — Progress Notes (Signed)
Pt's husband updated in waiting room - sent up to [facility]

## 2016-12-28 NOTE — Op Note (Signed)
Preop diagnosis: Right axillary abscess Postop diagnosis: Same Procedure performed: Incision and drainage of right axillary abscess Surgeon:Jadan Rouillard K. Anesthesia: Gen. endotracheal Indications: This is a 46 year old female who is two-week status post wide excision of a melanoma of her right wrist as well as right axillary sentinel lymph node biopsy. Several days ago the patient began developing some erythema around both of her incisions. She was examined in the office and this was felt to represent dermatitis. However over the last couple of days, the right wrist incision has improved that the right axilla has become more tender and feel swollen. She had fever up to 103. She came to the emergency department for evaluation. Her white blood cell count was noted to be elevated at 29. Ultrasound showed a large fluid collection in the axilla. Based on these findings, we recommended exploration under anesthesia in the operating room.  Description of procedure:  The patient is brought to the operating room and placed in a supine position on the operating room table. After an adequate level general anesthesia was obtained, the patient's right axilla was prepped with ChloraPrep and draped sterile fashion. A timeout was taken to ensure the proper patient and proper procedure. We infiltrated the area around the incision with 0.25% Marcaine with epinephrine. I opened the old incision. We dissected through several layers of suture closure and the subcutaneous fat until we entered a large pocket of cloudy peritoneal-looking fluid. We evacuated about 50 mL of fluid. This fluid was cultured and sent for microbiology. We open the wound widely and irrigated the abscess cavity. The size of the abscess cavity did show some fibrous exudate but there is no necrotic tissue. We thoroughly irrigated with sterile saline. The cavity was then packed with saline moistened Kerlix gauze. A dry dressing was applied. A large skin tag  was immediately adjacent to our incision this was removed. The patient's then extubated and brought to recovery room in stable condition. All sponge, instrument, and needle counts are correct.  Imogene Burn. Georgette Dover, MD, [facility]  12/28/2016 6:33 PM

## 2016-12-28 NOTE — Transfer of Care (Signed)
Immediate Anesthesia Transfer of Care Note  Patient: Samantha Castro  Procedure(s) Performed: Procedure(s): IRRIGATION AND DEBRIDEMENT AXILLARY ABSCESS (Right)  Patient Location: PACU  Anesthesia Type:General  Level of Consciousness: awake and alert   Airway & Oxygen Therapy: Patient Spontanous Breathing and Patient connected to nasal cannula oxygen  Post-op Assessment: Report given to RN and Post -op Vital signs reviewed and stable  Post vital signs: Reviewed and stable  Last Vitals:  Vitals:   12/28/16 1630 12/28/16 1645  BP: 139/78 131/75  Pulse: 74 76  Resp:    Temp:      Last Pain:  Vitals:   12/28/16 1213  TempSrc:   PainSc: 5          Complications: No apparent anesthesia complications

## 2016-12-29 ENCOUNTER — Encounter (HOSPITAL_COMMUNITY): Payer: Self-pay | Admitting: Surgery

## 2016-12-29 DIAGNOSIS — Z881 Allergy status to other antibiotic agents status: Secondary | ICD-10-CM | POA: Diagnosis not present

## 2016-12-29 DIAGNOSIS — I110 Hypertensive heart disease with heart failure: Secondary | ICD-10-CM | POA: Diagnosis not present

## 2016-12-29 DIAGNOSIS — L03111 Cellulitis of right axilla: Secondary | ICD-10-CM | POA: Diagnosis not present

## 2016-12-29 DIAGNOSIS — K219 Gastro-esophageal reflux disease without esophagitis: Secondary | ICD-10-CM | POA: Diagnosis not present

## 2016-12-29 DIAGNOSIS — L02411 Cutaneous abscess of right axilla: Secondary | ICD-10-CM | POA: Diagnosis not present

## 2016-12-29 DIAGNOSIS — Z79899 Other long term (current) drug therapy: Secondary | ICD-10-CM | POA: Diagnosis not present

## 2016-12-29 DIAGNOSIS — Z85820 Personal history of malignant melanoma of skin: Secondary | ICD-10-CM | POA: Diagnosis not present

## 2016-12-29 DIAGNOSIS — T814XXA Infection following a procedure, initial encounter: Secondary | ICD-10-CM | POA: Diagnosis not present

## 2016-12-29 DIAGNOSIS — Z888 Allergy status to other drugs, medicaments and biological substances status: Secondary | ICD-10-CM | POA: Diagnosis not present

## 2016-12-29 LAB — BASIC METABOLIC PANEL
Anion gap: 8 (ref 5–15)
BUN: 19 mg/dL (ref 6–20)
CALCIUM: 8.9 mg/dL (ref 8.9–10.3)
CO2: 22 mmol/L (ref 22–32)
CREATININE: 0.94 mg/dL (ref 0.44–1.00)
Chloride: 108 mmol/L (ref 101–111)
GFR calc Af Amer: >60 mL/min (ref >60)
Glucose, Bld: 207 mg/dL — ABNORMAL HIGH (ref 65–99)
Potassium: 3.5 mmol/L (ref 3.5–5.1)
Sodium: 138 mmol/L (ref 135–145)

## 2016-12-29 LAB — CBC
HCT: 34.1 % — ABNORMAL LOW (ref 36.0–46.0)
Hemoglobin: 11.6 g/dL — ABNORMAL LOW (ref 12.0–15.0)
MCH: 29.7 pg (ref 26.0–34.0)
MCHC: 34 g/dL (ref 30.0–36.0)
MCV: 87.4 fL (ref 78.0–100.0)
Platelets: 282 10*3/uL (ref 150–400)
RBC: 3.9 MIL/uL (ref 3.87–5.11)
RDW: 13.7 % (ref 11.5–15.5)
WBC: 24.3 10*3/uL — ABNORMAL HIGH (ref 4.0–10.5)

## 2016-12-29 MED ORDER — DOXYCYCLINE HYCLATE 100 MG PO TABS: 100.0000 mg | ORAL_TABLET | Freq: Two times a day (BID) | ORAL | 0 refills | Status: DC

## 2016-12-29 MED ORDER — DOXYCYCLINE HYCLATE 100 MG PO TABS
100.0000 mg | ORAL_TABLET | Freq: Two times a day (BID) | ORAL | Status: DC
  Administered 2016-12-29: 100 mg via ORAL
  Filled 2016-12-29: qty 1

## 2016-12-29 NOTE — Progress Notes (Signed)
Husband assisted with morning dressing change.  Stated he is comfortable doing the dressing changes. Discharge instructions gone over with patient and spouse. Home medications discussed. Prescription will be picked up at pharmacy.  Signs and symptoms of infection gone over. Diet incisional care and reasons to call the doctor discussed. Patient verbalized understanding of instructions.

## 2016-12-29 NOTE — Progress Notes (Signed)
1 Day Post-Op   Subjective/Chief Complaint: Feels much better   Objective: Vital signs in last 24 hours: Temp:  [97.9 F (36.6 C)-99.7 F (37.6 C)] 97.9 F (36.6 C) (07/30 0436) Pulse Rate:  [70-105] 70 (07/30 0436) Resp:  [17-23] 18 (07/30 0436) BP: (121-175)/(69-95) 129/77 (07/30 0436) SpO2:  [92 %-100 %] 94 % (07/30 0436) Weight:  [94.3 kg (208 lb)] 94.3 kg (208 lb) (07/29 2006) Last BM Date: 12/27/16  Intake/Output from previous day: 07/29 0701 - 07/30 0700 In: 1800 [I.V.:600; IV Piggyback:1100] Out: 100 [Urine:100] Intake/Output this shift: No intake/output data recorded.  Incision/Wound:right axilla OPEN AND CLEAN NO ERYTHEMA   Lab Results:   Recent Labs  12/28/16 0935 12/29/16 0721  WBC 29.3* 24.3*  HGB 12.6 11.6*  HCT 37.2 34.1*  PLT 268 282   BMET  Recent Labs  12/28/16 0935 12/29/16 0721  NA 138 138  K 3.1* 3.5  CL 102 108  CO2 27 22  GLUCOSE 117* 207*  BUN 17 19  CREATININE 1.00 0.94  CALCIUM 9.0 8.9   PT/INR  Recent Labs  12/28/16 0935  LABPROT 14.2  INR 1.09   ABG No results for input(s): PHART, HCO3 in the last 72 hours.  Invalid input(s): PCO2, PO2  Studies/Results: Dg Chest 2 View  Result Date: 12/28/2016 CLINICAL DATA:  Fever EXAM: CHEST  2 VIEW COMPARISON:  None. FINDINGS: Heart and mediastinal contours are within normal limits. No focal opacities or effusions. No acute bony abnormality. IMPRESSION: No active cardiopulmonary disease. Electronically Signed   By: Rolm Baptise M.D.   On: 12/28/2016 10:45   Dg Forearm Right  Result Date: 12/28/2016 CLINICAL DATA:  Right forearm cellulitis EXAM: RIGHT FOREARM - 2 VIEW COMPARISON:  None. FINDINGS: There is no evidence of fracture or other focal bone lesions. Soft tissues are unremarkable. IMPRESSION: Negative. Electronically Signed   By: Rolm Baptise M.D.   On: 12/28/2016 10:46   [place] Rt Upper Extrem Ltd Soft Tissue Non Vascular  Result Date: 12/28/2016 CLINICAL DATA:  Melanoma  status post right axillary node dissection 12/15/2016. Right axillary tenderness, erythema and fever for 3 days. Leukocytosis. Evaluate for seroma versus deep abscess. EXAM: ULTRASOUND RIGHT UPPER EXTREMITY LIMITED TECHNIQUE: Ultrasound examination of the upper extremity soft tissues was performed in the area of clinical concern. COMPARISON:  None. FINDINGS: Examination is targeted to the right axilla. There is a complex, septated cystic fluid collection measuring 5.9 x 3.2 x 4.9 cm. No solid components are identified. There is no significant surrounding hypervascularity on limited color Doppler imaging. IMPRESSION: Septated cystic fluid collection measuring up to 5.9 cm in the right axilla. Sonographic appearance is nonspecific for postoperative seroma versus abscess, and aspiration should be considered. Electronically Signed   By: Richardean Sale M.D.   On: 12/28/2016 15:00    Anti-infectives: Anti-infectives    Start     Dose/Rate Route Frequency Ordered Stop   12/28/16 2130  piperacillin-tazobactam (ZOSYN) IVPB 3.375 g     3.375 g 12.5 mL/hr over 240 Minutes Intravenous Every 8 hours 12/28/16 2017     12/28/16 1100  vancomycin (VANCOCIN) IVPB 1000 mg/200 mL premix     1,000 mg 200 mL/hr over 60 Minutes Intravenous  Once 12/28/16 1055 12/28/16 1225   12/28/16 1100  piperacillin-tazobactam (ZOSYN) IVPB 3.375 g     3.375 g 100 mL/hr over 30 Minutes Intravenous  Once 12/28/16 1055 12/28/16 1221      Assessment/Plan: s/p Procedure(s): IRRIGATION AND DEBRIDEMENT AXILLARY ABSCESS (Right)  DISCHARGE WET TO DRY DRESSING bid HUSBAND SAID HE CAN DO IT  DOXYCYCLINE 100 MG PO BID    FOLLOW UP Friday   LOS: 0 days    Samantha Castro A. 12/29/2016

## 2016-12-29 NOTE — Discharge Instructions (Signed)
WET TO DRY DRESSING CHANGE WITH GAUZE AND PAD TWICE A DAY TO AXILLA  CAN USE CLEAN WARM TAP WATER  DOXYCYCLINE EVERY 12 HOURS  RETURN Friday FOR FOLLOW UP

## 2016-12-29 NOTE — Discharge Summary (Signed)
Physician Discharge Summary  Patient ID: Samantha Castro MRN: [id number] DOB/AGE: 46-Apr-1972 46 y.o.  Admit date: 12/28/2016 Discharge date: 12/29/2016  Admission Diagnoses:  Discharge Diagnoses:  Active Problems:   Abscess of axillary region   Discharged Condition: good  Hospital Course: Pt admitted for incision and drainage of right axilla abscess at surgical site.  She did well.  Her pain was improved and she tolerated wound care.  She was afebrile and her WBC improved.  She was switched to doxycycline and discharged.  Husband voiced wound care ability.   Consults: None  Significant Diagnostic Studies: labs:  CBC    Component Value Date/Time   WBC 24.3 (H) 12/29/2016 0721   RBC 3.90 12/29/2016 0721   HGB 11.6 (L) 12/29/2016 0721   HCT 34.1 (L) 12/29/2016 0721   PLT 282 12/29/2016 0721   MCV 87.4 12/29/2016 0721   MCH 29.7 12/29/2016 0721   MCHC 34.0 12/29/2016 0721   RDW 13.7 12/29/2016 0721   LYMPHSABS 2.1 12/28/2016 0935   MONOABS 0.9 12/28/2016 0935   EOSABS 0.0 12/28/2016 0935   BASOSABS 0.0 12/28/2016 0935    Treatments: surgery: incision and drainage   Discharge Exam: Blood pressure 129/77, pulse 70, temperature 97.9 F (36.6 C), temperature source Oral, resp. rate 18, height 5\' 4"  (1.626 m), weight 94.3 kg (208 lb), SpO2 94 %. General appearance: alert and cooperative Resp: clear to auscultation bilaterally Incision/Wound:open and clean  Packing in place no foul smell or erythema   Disposition: 01-Home or Self Care  Discharge Instructions    Diet - low sodium heart healthy    Complete by:  As directed    Increase activity slowly    Complete by:  As directed      Allergies as of 12/29/2016      Reactions   Bacitracin-polymyxin B Rash   Lisinopril Other (See Comments)   Unknown   Vancomycin Hives      Medication List    TAKE these medications   doxycycline 100 MG tablet Commonly known as:  VIBRA-TABS Take 1 tablet (100 mg total) by mouth  every 12 (twelve) hours.   ibuprofen 800 MG tablet Commonly known as:  ADVIL,MOTRIN Take 1 tablet (800 mg total) by mouth every 8 (eight) hours as needed. What changed:  reasons to take this   losartan 25 MG tablet Commonly known as:  COZAAR Take 25 mg by mouth 2 (two) times daily.   ondansetron 8 MG disintegrating tablet Commonly known as:  ZOFRAN ODT Take 1 tablet (8 mg total) by mouth every 8 (eight) hours as needed for nausea or vomiting.   oxyCODONE 5 MG immediate release tablet Commonly known as:  Oxy IR/ROXICODONE Take 1-2 tablets (5-10 mg total) by mouth every 6 (six) hours as needed for severe pain.   pantoprazole 40 MG tablet Commonly known as:  PROTONIX Take 40 mg by mouth daily.        Signed: Enisa Runyan A. 12/29/2016, 9:03 AM

## 2016-12-30 LAB — URINE CULTURE

## 2017-01-02 LAB — AEROBIC/ANAEROBIC CULTURE W GRAM STAIN (SURGICAL/DEEP WOUND)

## 2017-01-02 LAB — CULTURE, BLOOD (ROUTINE X 2)
CULTURE: NO GROWTH
Culture: NO GROWTH
SPECIAL REQUESTS: ADEQUATE
Special Requests: ADEQUATE

## 2017-01-02 LAB — AEROBIC/ANAEROBIC CULTURE (SURGICAL/DEEP WOUND): CULTURE: NO GROWTH

## 2017-01-15 ENCOUNTER — Encounter: Payer: Self-pay | Admitting: Oncology

## 2017-01-15 ENCOUNTER — Telehealth: Payer: Self-pay | Admitting: Oncology

## 2017-01-15 NOTE — Telephone Encounter (Signed)
Appt has been scheduled for the pt to see Dr. Alen Blew on 8/24 at 2pm. Pt aware to arrive 30 minutes early. Address and insurance verified. Letter mailed to the pt.

## 2017-01-23 ENCOUNTER — Ambulatory Visit: Payer: 59 | Admitting: Oncology

## 2017-02-27 MED FILL — LINZESS 72 MCG CAPSULE: 72 | 30 days supply | Qty: 30 | Fill #0

## 2017-03-05 DIAGNOSIS — Z809 Family history of malignant neoplasm, unspecified: Secondary | ICD-10-CM | POA: Diagnosis not present

## 2017-03-05 DIAGNOSIS — Z8520 Personal history of malignant neoplasm of unspecified respiratory organ: Secondary | ICD-10-CM | POA: Diagnosis not present

## 2017-03-05 DIAGNOSIS — Z800 Family history of malignant neoplasm of digestive organs: Secondary | ICD-10-CM | POA: Diagnosis not present

## 2017-03-09 DIAGNOSIS — I10 Essential (primary) hypertension: Secondary | ICD-10-CM | POA: Diagnosis not present

## 2017-03-10 DIAGNOSIS — D2261 Melanocytic nevi of right upper limb, including shoulder: Secondary | ICD-10-CM | POA: Diagnosis not present

## 2017-03-10 DIAGNOSIS — D2271 Melanocytic nevi of right lower limb, including hip: Secondary | ICD-10-CM | POA: Diagnosis not present

## 2017-03-10 DIAGNOSIS — D2272 Melanocytic nevi of left lower limb, including hip: Secondary | ICD-10-CM | POA: Diagnosis not present

## 2017-03-10 DIAGNOSIS — Z85820 Personal history of malignant melanoma of skin: Secondary | ICD-10-CM | POA: Diagnosis not present

## 2017-03-10 DIAGNOSIS — L820 Inflamed seborrheic keratosis: Secondary | ICD-10-CM | POA: Diagnosis not present

## 2017-03-10 DIAGNOSIS — D485 Neoplasm of uncertain behavior of skin: Secondary | ICD-10-CM | POA: Diagnosis not present

## 2017-03-10 DIAGNOSIS — D2262 Melanocytic nevi of left upper limb, including shoulder: Secondary | ICD-10-CM | POA: Diagnosis not present

## 2017-03-10 DIAGNOSIS — D225 Melanocytic nevi of trunk: Secondary | ICD-10-CM | POA: Diagnosis not present

## 2017-03-12 ENCOUNTER — Encounter: Payer: Self-pay | Admitting: Genetic Counselor

## 2017-03-12 ENCOUNTER — Other Ambulatory Visit: Payer: 59

## 2017-03-12 ENCOUNTER — Ambulatory Visit (HOSPITAL_BASED_OUTPATIENT_CLINIC_OR_DEPARTMENT_OTHER): Payer: 59 | Admitting: Genetic Counselor

## 2017-03-12 DIAGNOSIS — Z7183 Encounter for nonprocreative genetic counseling: Secondary | ICD-10-CM

## 2017-03-12 DIAGNOSIS — Z1379 Encounter for other screening for genetic and chromosomal anomalies: Secondary | ICD-10-CM

## 2017-03-12 DIAGNOSIS — Z800 Family history of malignant neoplasm of digestive organs: Secondary | ICD-10-CM | POA: Diagnosis not present

## 2017-03-12 DIAGNOSIS — Z85820 Personal history of malignant melanoma of skin: Secondary | ICD-10-CM | POA: Diagnosis not present

## 2017-03-12 DIAGNOSIS — Z801 Family history of malignant neoplasm of trachea, bronchus and lung: Secondary | ICD-10-CM | POA: Diagnosis not present

## 2017-03-12 DIAGNOSIS — Z803 Family history of malignant neoplasm of breast: Secondary | ICD-10-CM | POA: Diagnosis not present

## 2017-03-12 HISTORY — DX: Encounter for other screening for genetic and chromosomal anomalies: Z13.79

## 2017-03-12 NOTE — Progress Notes (Signed)
[facility]      Initial Visit   Patient Name: Samantha Castro Patient DOB: 28-Sep-1970 Patient Age: 46 y.o. Encounter Date: 03/12/2017  Referring Provider: Alexis Goodell. Ronnald Ramp, MD  Primary Care Provider: Wayland Salinas, MD  Reason for Visit: Evaluate for hereditary susceptibility to cancer    Assessment and Plan:  . Samantha Castro's history is not highly suggestive of a hereditary predisposition to cancer. Even though she had multiple melanomas, she described excessive use of tanning beds when she was younger. A genetics evaluation can help further define her risk, though, with the understanding that hereditary causes of melanoma are not fully understood.  . Testing is recommended to determine whether she has a pathogenic mutation that will impact her screening and risk-reduction for cancer. A negative result will be reassuring.  . Samantha Castro wished to pursue genetic testing and a blood sample will be sent for analysis of the 83 genes on [facility]'s Multi-Cancer panel (ALK, APC, ATM, AXIN2, BAP1, BARD1, BLM, BMPR1A, BRCA1, BRCA2, BRIP1, CASR, CDC73, CDH1, CDK4, CDKN1B, CDKN1C, CDKN2A, CEBPA, CHEK2, CTNNA1, DICER1, DIS3L2, EGFR, EPCAM, FH, FLCN, GATA2, GPC3, GREM1, HOXB13, HRAS, KIT, MAX, MEN1, MET, MITF, MLH1, MSH2, MSH3, MSH6, MUTYH, NBN, NF1, NF2, NTHL1, PALB2, PDGFRA, PHOX2B, PMS2, POLD1, POLE, POT1, PRKAR1A, PTCH1, PTEN, RAD50, RAD51C, RAD51D, RB1, RECQL4, RET, RUNX1, SDHA, SDHAF2, SDHB, SDHC, SDHD, SMAD4, SMARCA4, SMARCB1, SMARCE1, STK11, SUFU, TERC, TERT, TMEM127, TP53, TSC1, TSC2, VHL, WRN, WT1).   . Results should be available in approximately 2-4 weeks, at which point we will contact her and address implications for her as well as address genetic testing for at-risk family members, if needed.     Dr. Jana Hakim was available for questions concerning this case. Total time spent by me in face-to-face counseling was approximately 35 minutes.    _____________________________________________________________________   History of Present Illness: Samantha Castro, a 46 y.o. female, is being seen at the [facility] due to a personal and family history of cancer. She presents to clinic today to discuss the possibility of a hereditary predisposition to cancer and discuss whether genetic testing is warranted.  [name] reports having a history of excisions of melanomas of 6 different sites involving her upper right thigh, both calves and most recently her wrist.  She has a dermatologic evaluation every 3 months. The first occurrence was at age 30.  She reports a history of TAH/BSO.  Past Medical History:  Diagnosis Date  . Allergic rhinitis   . Cancer ([place])    melanoma  . Endometriosis   . Family history of breast cancer   . Family history of colon cancer   . GERD (gastroesophageal reflux disease)   . History of melanoma   . Hypertension   . PONV (postoperative nausea and vomiting)     Past Surgical History:  Procedure Laterality Date  . ABDOMINAL HYSTERECTOMY    . APPENDECTOMY    . ARTHROSCOPIC REPAIR ACL    . COLON RESECTION    . COLON SURGERY    . EXCISION MELANOMA WITH SENTINEL LYMPH NODE BIOPSY Right 12/15/2016   Procedure: WIDE EXCISION RIGHT WRIST  MELANOMA WITH RIGHT SENTINEL NODE MAPPING;  Surgeon: Erroll Luna, MD;  Location: [facility];  Service: General;  Laterality: Right;  . IRRIGATION AND DEBRIDEMENT ABSCESS Right 12/28/2016   Procedure: IRRIGATION AND DEBRIDEMENT AXILLARY ABSCESS;  Surgeon: Donnie Mesa, MD;  Location: [facility];  Service: General;  Laterality: Right;  . MELANOMA EXCISION    . PELVIC LAPAROSCOPY      Social History   Social History  . Marital status: Married    Spouse name: N/A  . Number of children: N/A  . Years of education: N/A   Social History Main Topics  . Smoking status: Never Smoker  . Smokeless tobacco: Never Used  . Alcohol  use No  . Drug use: No  . Sexual activity: Not on file   Other Topics Concern  . Not on file   Social History Narrative  . No narrative on file     Family History:  During the visit, a 4-generation pedigree was obtained. Family tree will be scanned in the Media tab in Epic  Significant diagnoses include the following:  Family History  Problem Relation Age of Onset  . Lung cancer Mother 57       deceased 59  . Hypertension Mother   . Colon cancer Mother        dx 73s  . Lung cancer Father 34       deceased 67  . Heart disease Father   . Hypertension Father   . Lung cancer Maternal Uncle   . Breast cancer Paternal Aunt        dx 79s; currently 35s  . Breast cancer Paternal Grandmother 20       deceased 51s  . Lung cancer Maternal Uncle   . Pancreatic cancer Maternal Uncle   . Melanoma Cousin        deceased 13    Additionally, Samantha Castro has a son (age 78) and a brother (age 11). Her mother had a total of 3 brothers and 3 sisters. Her father had a total of 2 sisters.  Samantha Castro ancestry is Caucasian - NOS. There is no known Jewish ancestry and no consanguinity.  Discussion: We reviewed the characteristics, features and inheritance patterns of hereditary cancer syndromes. We discussed her risk of harboring a mutation in the context of her personal and family history. We discussed the process of genetic testing, insurance coverage and implications of results: positive, negative and variant of unknown significance (VUS).    Ms. Palencia questions were answered to her satisfaction today and she is welcome to call with any additional questions or concerns. Thank you for the referral and allowing [place] to share in the care of your patient.    Steele Berg, MS, [place] Certified Genetic Counselor phone: [phone] [email or web address]   ______________________________________________________________________ For Office Staff:  Number of people involved in session:  1 Was an Intern/ student involved with case: no

## 2017-03-23 MED FILL — LINZESS 72 MCG CAPSULE: 72 | 30 days supply | Qty: 30 | Fill #1

## 2017-03-31 ENCOUNTER — Ambulatory Visit: Payer: Self-pay | Admitting: Genetic Counselor

## 2017-03-31 ENCOUNTER — Encounter: Payer: Self-pay | Admitting: Genetic Counselor

## 2017-03-31 DIAGNOSIS — Z1379 Encounter for other screening for genetic and chromosomal anomalies: Secondary | ICD-10-CM

## 2017-03-31 NOTE — Progress Notes (Signed)
[facility]        Results Disclosure    Patient Name: Samantha Castro Patient DOB: [birth year] Patient Age: 46 y.o. Encounter Date: 03/31/2017  Referring Provider: Danella Sensing, MD  Reason for Call: Discuss results of genetic testing    Samantha Castro was seen in the [facility] on 03/12/2017 due to a personal and family history of cancer and concern regarding a hereditary predisposition to cancer in the family. Please refer to the prior Genetics clinic note for more information regarding Samantha Castro' medical and family histories and our assessment at the time.   Genetic Testing: At the time of Samantha Castro' visit, we recommended she pursue genetic testing of multiple genes associated with hereditary predisposition to cancer. Testing included sequencing and deletion/duplication analysis. 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  A copy of the genetic test report will be scanned into Epic under the media tab.  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 The lab was not able to determine whether this variant is in exon 13 of the PMS2 gene or in exon 4 of its pseudogene called PMS2CL. The variant nomenclature is [id number], p.Asp752Asn. This is not an actionable result. With time, we suspect the lab will determine the significance of it, if any. If we do learn more about it, we will try to contact Samantha Castro to discuss it further. However, it is important that she stay in touch with [place] periodically and keep the address and phone number up to date. Medical management and screening are not impacted by the identification of a VUS.  The genes on the panel were the 47 genes on [facility]'s Common Cancers panel (APC, ATM, AXIN2, BARD1, BMPR1A, BRCA1, BRCA2, BRIP1, CDH1, CDK4, CDKN2A, CHEK2, CTNNA1, DICER1, EPCAM, GREM1, HOXB13, KIT, MEN1, MLH1, MSH2, MSH3, MSH6, MUTYH, NBN, NF1, NTHL1,  PALB2, PDGFRA, PMS2, POLD1, POLE, PTEN, RAD50, RAD51C, RAD51D, SDHA, SDHB, SDHC, SDHD, SMAD4, SMARCA4, STK11, TP53, TSC1, TSC2, VHL).  MUTYH Risks: We discussed 1-2% of individuals of [place] European descent are carriers of a MUTYH mutation. There is conflicting data regarding whether a single MUTYH mutation confers a moderate increased risk (up to 2-fold) for colorectal cancer. If an individual inherits two pathogenic mutations, they have a recessive form of hereditary colonic polyposis called MUTYH-Associated Polyposis (MAP). We discussed that this result does not explain Samantha Castro' history of melanoma and should not be over interpreted.  Medical Management: We emphasized that being a carrier of a single MUTYH mutation is not thought to significantly increase cancer risk. Samantha Castro is recommended to follow routine cancer screenings with the exception of screening for colorectal cancer. Her skin cancer screenings will be determined by her dermatologist based on her history of melanoma.   The National Comprehensive Cancer Network Genetic/Familial High Risk Assessment: Colorectal (V1.2018) recommends the following in those with a single MUTYH mutation:  - Beginning at age 74 or 46 years younger than the earliest diagnosis of colorectal cancer in a parent, sibling, or child (whichever is earlier): Colonoscopy every 5 years. If there is no family history of colorectal cancer, data are uncertain if specialized screening is warranted. Given that Samantha Castro has a family history of colon cancer in her mother, she is recommended to have a colonoscopy every 5 years.  - These recommendations may change if an individual has polyps, colorectal cancer, inflammatory bowel disease (IBD), or family history of colorectal  cancer.  Family Members: It is important that Samantha Castro informs her relatives of these results. They are recommended to speak with a genetic counselor prior to any testing. A genetic counselor can be  located at [email or web address]. Family members should not get tested for the above VUS outside of a research protocol as this finding has no implications for their medical management.  Samantha Castro' son has a 50% chance to have inherited this mutation. However, this is of no consequence to his and he is not recommended to get tested at this time. We discussed that one option would be for her husband to pursue testing to see whether he is a carrier of a MUTYH mutation as well. Only then would their son be at risk of having MUTYH-associated polyposis due to having 2 MUTYH mutations.  We encouraged Samantha Castro to remain in contact with [place] on an annual basis so we can update her personal and family histories, and let her know of advances in cancer genetics that may benefit the family. Our contact number was provided. Samantha Castro' questions were answered to her satisfaction today, and she knows she is welcome to call anytime with additional questions.    Steele Berg, MS, [place] Certified Genetic Counselor phone: [phone] [email or web address]

## 2017-04-02 MED FILL — LINZESS 290 MCG CAPSULE: 290 | 30 days supply | Qty: 30 | Fill #0

## 2017-04-21 DIAGNOSIS — K5900 Constipation, unspecified: Secondary | ICD-10-CM | POA: Diagnosis not present

## 2017-04-21 DIAGNOSIS — N200 Calculus of kidney: Secondary | ICD-10-CM | POA: Diagnosis not present

## 2017-04-21 DIAGNOSIS — Z800 Family history of malignant neoplasm of digestive organs: Secondary | ICD-10-CM | POA: Diagnosis not present

## 2017-05-05 DIAGNOSIS — D2271 Melanocytic nevi of right lower limb, including hip: Secondary | ICD-10-CM | POA: Diagnosis not present

## 2017-05-05 DIAGNOSIS — D485 Neoplasm of uncertain behavior of skin: Secondary | ICD-10-CM | POA: Diagnosis not present

## 2017-05-05 DIAGNOSIS — D2272 Melanocytic nevi of left lower limb, including hip: Secondary | ICD-10-CM | POA: Diagnosis not present

## 2017-05-05 DIAGNOSIS — D2262 Melanocytic nevi of left upper limb, including shoulder: Secondary | ICD-10-CM | POA: Diagnosis not present

## 2017-05-05 DIAGNOSIS — C44619 Basal cell carcinoma of skin of left upper limb, including shoulder: Secondary | ICD-10-CM | POA: Diagnosis not present

## 2017-05-05 DIAGNOSIS — Z85820 Personal history of malignant melanoma of skin: Secondary | ICD-10-CM | POA: Diagnosis not present

## 2017-05-05 DIAGNOSIS — D2261 Melanocytic nevi of right upper limb, including shoulder: Secondary | ICD-10-CM | POA: Diagnosis not present

## 2017-05-05 DIAGNOSIS — D225 Melanocytic nevi of trunk: Secondary | ICD-10-CM | POA: Diagnosis not present

## 2017-05-15 MED FILL — AMITIZA 24 MCG CAPSULES: 24 | 30 days supply | Qty: 60 | Fill #0

## 2017-05-18 DIAGNOSIS — R922 Inconclusive mammogram: Secondary | ICD-10-CM | POA: Diagnosis not present

## 2017-05-18 DIAGNOSIS — Z01419 Encounter for gynecological examination (general) (routine) without abnormal findings: Secondary | ICD-10-CM | POA: Diagnosis not present

## 2017-08-19 ENCOUNTER — Other Ambulatory Visit: Payer: Self-pay | Admitting: Obstetrics and Gynecology

## 2017-08-19 DIAGNOSIS — R922 Inconclusive mammogram: Secondary | ICD-10-CM

## 2017-08-19 DIAGNOSIS — R9230 Dense breasts, unspecified: Secondary | ICD-10-CM

## 2017-11-25 ENCOUNTER — Other Ambulatory Visit: Payer: Self-pay | Admitting: Obstetrics and Gynecology

## 2017-11-25 DIAGNOSIS — N6310 Unspecified lump in the right breast, unspecified quadrant: Secondary | ICD-10-CM

## 2017-11-26 ENCOUNTER — Other Ambulatory Visit: Payer: Self-pay | Admitting: Obstetrics and Gynecology

## 2017-12-07 ENCOUNTER — Ambulatory Visit
Admission: RE | Admit: 2017-12-07 | Discharge: 2017-12-07 | Disposition: A | Discharge status: Home / Self Care | Payer: 59 | Source: Ambulatory Visit | Attending: Obstetrics and Gynecology | Admitting: Obstetrics and Gynecology

## 2017-12-07 DIAGNOSIS — N6310 Unspecified lump in the right breast, unspecified quadrant: Secondary | ICD-10-CM

## 2018-01-19 ENCOUNTER — Other Ambulatory Visit: Payer: Self-pay | Admitting: Orthopedic Surgery

## 2018-01-19 DIAGNOSIS — M25561 Pain in right knee: Secondary | ICD-10-CM

## 2018-01-20 ENCOUNTER — Ambulatory Visit
Admission: RE | Admit: 2018-01-20 | Discharge: 2018-01-20 | Disposition: A | Discharge status: Home / Self Care | Payer: PRIVATE HEALTH INSURANCE | Source: Ambulatory Visit | Attending: Orthopedic Surgery | Admitting: Orthopedic Surgery

## 2018-01-20 DIAGNOSIS — M25561 Pain in right knee: Secondary | ICD-10-CM

## 2018-01-21 ENCOUNTER — Other Ambulatory Visit: Payer: PRIVATE HEALTH INSURANCE

## 2018-02-18 ENCOUNTER — Other Ambulatory Visit: Payer: Self-pay | Admitting: Orthopedic Surgery

## 2018-02-18 DIAGNOSIS — M79604 Pain in right leg: Secondary | ICD-10-CM

## 2018-02-19 ENCOUNTER — Ambulatory Visit
Admission: RE | Admit: 2018-02-19 | Discharge: 2018-02-19 | Disposition: A | Discharge status: Home / Self Care | Payer: PRIVATE HEALTH INSURANCE | Source: Ambulatory Visit | Attending: Orthopedic Surgery | Admitting: Orthopedic Surgery

## 2018-02-19 DIAGNOSIS — M79604 Pain in right leg: Secondary | ICD-10-CM

## 2018-05-03 ENCOUNTER — Encounter (HOSPITAL_COMMUNITY): Payer: Self-pay | Admitting: Emergency Medicine

## 2018-05-03 ENCOUNTER — Emergency Department (HOSPITAL_COMMUNITY)
Admission: EM | Admit: 2018-05-03 | Discharge: 2018-05-04 | Disposition: A | Discharge status: Home / Self Care | Payer: 59 | Attending: Emergency Medicine | Admitting: Emergency Medicine

## 2018-05-03 ENCOUNTER — Other Ambulatory Visit: Payer: Self-pay

## 2018-05-03 DIAGNOSIS — R509 Fever, unspecified: Secondary | ICD-10-CM | POA: Diagnosis not present

## 2018-05-03 DIAGNOSIS — Z79899 Other long term (current) drug therapy: Secondary | ICD-10-CM | POA: Diagnosis not present

## 2018-05-03 DIAGNOSIS — M545 Low back pain: Secondary | ICD-10-CM | POA: Insufficient documentation

## 2018-05-03 DIAGNOSIS — R1031 Right lower quadrant pain: Secondary | ICD-10-CM | POA: Insufficient documentation

## 2018-05-03 DIAGNOSIS — Z85828 Personal history of other malignant neoplasm of skin: Secondary | ICD-10-CM | POA: Insufficient documentation

## 2018-05-03 DIAGNOSIS — Z87442 Personal history of urinary calculi: Secondary | ICD-10-CM | POA: Diagnosis not present

## 2018-05-03 DIAGNOSIS — R109 Unspecified abdominal pain: Secondary | ICD-10-CM

## 2018-05-03 LAB — URINALYSIS, ROUTINE W REFLEX MICROSCOPIC
BILIRUBIN URINE: NEGATIVE
GLUCOSE, UA: NEGATIVE mg/dL
HGB URINE DIPSTICK: NEGATIVE
KETONES UR: NEGATIVE mg/dL
Leukocytes, UA: NEGATIVE
Nitrite: NEGATIVE
PROTEIN: NEGATIVE mg/dL
Specific Gravity, Urine: 1.032 — ABNORMAL HIGH (ref 1.005–1.030)
pH: 5 (ref 5.0–8.0)

## 2018-05-03 LAB — CBC WITH DIFFERENTIAL/PLATELET
Abs Immature Granulocytes: 0.03 10*3/uL (ref 0.00–0.07)
Basophils Absolute: 0.1 10*3/uL (ref 0.0–0.1)
Basophils Relative: 1 %
Eosinophils Absolute: 0.2 10*3/uL (ref 0.0–0.5)
Eosinophils Relative: 2 %
HCT: 40.1 % (ref 36.0–46.0)
Hemoglobin: 13.3 g/dL (ref 12.0–15.0)
Immature Granulocytes: 0 %
Lymphocytes Relative: 26 %
Lymphs Abs: 2.8 10*3/uL (ref 0.7–4.0)
MCH: 30.1 pg (ref 26.0–34.0)
MCHC: 33.2 g/dL (ref 30.0–36.0)
MCV: 90.7 fL (ref 80.0–100.0)
Monocytes Absolute: 0.5 10*3/uL (ref 0.1–1.0)
Monocytes Relative: 5 %
NEUTROS PCT: 66 %
Neutro Abs: 7 10*3/uL (ref 1.7–7.7)
Platelets: 353 10*3/uL (ref 150–400)
RBC: 4.42 MIL/uL (ref 3.87–5.11)
RDW: 13.1 % (ref 11.5–15.5)
WBC: 10.6 10*3/uL — ABNORMAL HIGH (ref 4.0–10.5)
nRBC: 0 % (ref 0.0–0.2)

## 2018-05-03 LAB — I-STAT BETA HCG BLOOD, ED (MC, WL, AP ONLY): HCG, QUANTITATIVE: 45.8 m[IU]/mL — AB (ref <5)

## 2018-05-03 LAB — COMPREHENSIVE METABOLIC PANEL
ALT: 30 U/L (ref 0–44)
AST: 22 U/L (ref 15–41)
Albumin: 4 g/dL (ref 3.5–5.0)
Alkaline Phosphatase: 87 U/L (ref 38–126)
Anion gap: 10 (ref 5–15)
BUN: 20 mg/dL (ref 6–20)
CO2: 26 mmol/L (ref 22–32)
Calcium: 9.1 mg/dL (ref 8.9–10.3)
Chloride: 102 mmol/L (ref 98–111)
Creatinine, Ser: 0.88 mg/dL (ref 0.44–1.00)
GFR calc Af Amer: >60 mL/min (ref >60)
GFR calc non Af Amer: >60 mL/min (ref >60)
Glucose, Bld: 112 mg/dL — ABNORMAL HIGH (ref 70–99)
POTASSIUM: 3.6 mmol/L (ref 3.5–5.1)
Sodium: 138 mmol/L (ref 135–145)
TOTAL PROTEIN: 7.7 g/dL (ref 6.5–8.1)
Total Bilirubin: 0.5 mg/dL (ref 0.3–1.2)

## 2018-05-03 LAB — LIPASE, BLOOD: Lipase: 30 U/L (ref 11–51)

## 2018-05-03 NOTE — ED Triage Notes (Signed)
Per pt she has been having right sided flank pain that radiates to her back and into her groin area. Pt stated she has a hx of kidney stones. Pt stated she has not been able to urinate very much today. Stated she was running a fever of 101.3 before leaving home.

## 2018-05-03 NOTE — ED Notes (Signed)
Per lab, pt needs lavender send, EMT notified.

## 2018-05-04 ENCOUNTER — Emergency Department (HOSPITAL_COMMUNITY): Payer: 59

## 2018-05-04 LAB — I-STAT CG4 LACTIC ACID, ED: Lactic Acid, Venous: 0.89 mmol/L (ref 0.5–1.9)

## 2018-05-04 MED ORDER — SODIUM CHLORIDE 0.9 % IV BOLUS
1000.0000 mL | Freq: Once | INTRAVENOUS | Status: AC
  Administered 2018-05-04: 1000 mL via INTRAVENOUS

## 2018-05-04 MED ORDER — IOHEXOL 300 MG/ML  SOLN
100.0000 mL | Freq: Once | INTRAMUSCULAR | Status: AC | PRN
  Administered 2018-05-04: 100 mL via INTRAVENOUS

## 2018-05-04 MED ORDER — CYCLOBENZAPRINE HCL 10 MG PO TABS: 10.0000 mg | ORAL_TABLET | Freq: Three times a day (TID) | ORAL | 0 refills | Status: DC | PRN

## 2018-05-04 MED ORDER — ONDANSETRON HCL 4 MG/2ML IJ SOLN
4.0000 mg | Freq: Once | INTRAMUSCULAR | Status: DC
  Filled 2018-05-04: qty 2

## 2018-05-04 MED ORDER — ONDANSETRON 4 MG PO TBDP: 4.0000 mg | ORAL_TABLET | Freq: Three times a day (TID) | ORAL | 0 refills | Status: DC | PRN

## 2018-05-04 MED ORDER — METOCLOPRAMIDE HCL 5 MG/ML IJ SOLN
10.0000 mg | Freq: Once | INTRAMUSCULAR | Status: AC
  Administered 2018-05-04: 10 mg via INTRAVENOUS
  Filled 2018-05-04: qty 2

## 2018-05-04 MED ORDER — ONDANSETRON HCL 4 MG/2ML IJ SOLN
4.0000 mg | Freq: Once | INTRAMUSCULAR | Status: AC
  Administered 2018-05-04: 4 mg via INTRAVENOUS
  Filled 2018-05-04: qty 2

## 2018-05-04 MED ORDER — ONDANSETRON HCL 4 MG/2ML IJ SOLN
4.0000 mg | Freq: Once | INTRAMUSCULAR | Status: AC
  Administered 2018-05-04: 4 mg via INTRAVENOUS

## 2018-05-04 MED ORDER — HYDROMORPHONE HCL 1 MG/ML IJ SOLN
1.0000 mg | Freq: Once | INTRAMUSCULAR | Status: AC
  Administered 2018-05-04: 1 mg via INTRAVENOUS
  Filled 2018-05-04: qty 1

## 2018-05-04 MED ORDER — LORAZEPAM 2 MG/ML IJ SOLN
1.0000 mg | Freq: Once | INTRAMUSCULAR | Status: DC
  Filled 2018-05-04: qty 1

## 2018-05-04 NOTE — ED Notes (Signed)
Patient transported to CT 

## 2018-05-04 NOTE — ED Provider Notes (Signed)
[facility] EMERGENCY DEPARTMENT Provider Note   CSN: [id number] Arrival date & time: 05/03/18  1810     History   Chief Complaint Chief Complaint  Patient presents with  . Flank Pain    HPI Samantha Castro is a 47 y.o. female.  Patient presents to the emergency department for evaluation of back, flank and abdominal pain.  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  The pain was straight across her lower back.  She reports that she does have a history of bulging disks.  Over the last couple of days, the pain is worsened.  Today pain has become severe and now radiates into the right groin and right hip area.  She feels that she has had decreased urine output today, does have a history of kidney stones.  She reports that she took her temperature at home today and it was 101.3.  She has been taking Advil and Tylenol all day, no fever at arrival to the ER.     Past Medical History:  Diagnosis Date  . Allergic rhinitis   . Cancer ([place])    melanoma  . Endometriosis   . Family history of breast cancer   . Family history of colon cancer   . Genetic testing 03/12/2017   Common Cancers panel (47 genes) @ [facility]- Single pathogenic MUTYH mutation  . GERD (gastroesophageal reflux disease)   . History of melanoma   . Hypertension   . PONV (postoperative nausea and vomiting)     Patient Active Problem List   Diagnosis Date Noted  . Genetic testing 03/12/2017  . History of melanoma   . Family history of breast cancer   . Family history of colon cancer   . Abscess of axillary region 12/28/2016    Past Surgical History:  Procedure Laterality Date  . ABDOMINAL HYSTERECTOMY    . APPENDECTOMY    . ARTHROSCOPIC REPAIR ACL    . COLON RESECTION    . COLON SURGERY    . EXCISION MELANOMA WITH SENTINEL LYMPH NODE BIOPSY Right 12/15/2016   Procedure: WIDE EXCISION RIGHT WRIST  MELANOMA WITH RIGHT  SENTINEL NODE MAPPING;  Surgeon: Erroll Luna, MD;  Location: [facility];  Service: General;  Laterality: Right;  . IRRIGATION AND DEBRIDEMENT ABSCESS Right 12/28/2016   Procedure: IRRIGATION AND DEBRIDEMENT AXILLARY ABSCESS;  Surgeon: Donnie Mesa, MD;  Location: [facility];  Service: General;  Laterality: Right;  . MELANOMA EXCISION    . PELVIC LAPAROSCOPY       OB History   None      Home Medications    Prior to Admission medications   Medication Sig Start Date End Date Taking? Authorizing Provider  losartan (COZAAR) 25 MG tablet Take 25 mg by mouth 2 (two) times daily.    Yes [provider]  pantoprazole (PROTONIX) 40 MG tablet Take 40 mg by mouth daily.   Yes [provider]  cyclobenzaprine (FLEXERIL) 10 MG tablet Take 1 tablet (10 mg total) by mouth 3 (three) times daily as needed for muscle spasms. 05/04/18   Orpah Greek, MD  ondansetron (ZOFRAN ODT) 4 MG disintegrating tablet Take 1 tablet (4 mg total) by mouth every 8 (eight) hours as needed for nausea or vomiting. 05/04/18   Orpah Greek, MD    Family History Family History  Problem Relation Age of Onset  . Lung cancer Mother 56  deceased 30  . Hypertension Mother   . Colon cancer Mother        dx 72s  . Lung cancer Father 49       deceased 18  . Heart disease Father   . Hypertension Father   . Lung cancer Maternal Uncle   . Breast cancer Paternal Aunt        dx 52s; currently 25s  . Breast cancer Paternal Grandmother 24       deceased 59s  . Lung cancer Maternal Uncle   . Pancreatic cancer Maternal Uncle   . Melanoma Cousin        deceased 29    Social History Social History   Tobacco Use  . Smoking status: Never Smoker  . Smokeless tobacco: Never Used  Substance Use Topics  . Alcohol use: No  . Drug use: No     Allergies   Bacitracin-polymyxin b; Lisinopril; and Vancomycin   Review of Systems Review of Systems  Constitutional:  Positive for fever.  Gastrointestinal: Positive for abdominal pain.  Genitourinary: Positive for decreased urine volume and flank pain.  Musculoskeletal: Positive for back pain.  All other systems reviewed and are negative.    Physical Exam Updated Vital Signs BP (!) 153/93   Pulse 65   Temp 99 F (37.2 C) (Oral)   Resp 16   Ht 5\' 4"  (1.626 m)   Wt 95.3 kg   SpO2 100%   BMI 36.05 kg/m   Physical Exam  Constitutional: She is oriented to person, place, and time. She appears well-developed and well-nourished. No distress.  HENT:  Head: Normocephalic and atraumatic.  Right Ear: Hearing normal.  Left Ear: Hearing normal.  Nose: Nose normal.  Mouth/Throat: Oropharynx is clear and moist and mucous membranes are normal.  Eyes: Pupils are equal, round, and reactive to light. Conjunctivae and EOM are normal.  Neck: Normal range of motion. Neck supple.  Cardiovascular: Regular rhythm, S1 normal and S2 normal. Tachycardia present. Exam reveals no gallop and no friction rub.  No murmur heard. Pulmonary/Chest: Effort normal and breath sounds normal. No respiratory distress. She exhibits no tenderness.  Abdominal: Soft. Normal appearance and bowel sounds are normal. There is no hepatosplenomegaly. There is tenderness in the right lower quadrant. There is no rebound, no guarding, no tenderness at McBurney's point and negative Murphy's sign. No hernia.  Musculoskeletal: Normal range of motion.  Neurological: She is alert and oriented to person, place, and time. She has normal strength. No cranial nerve deficit or sensory deficit. Coordination normal. GCS eye subscore is 4. GCS verbal subscore is 5. GCS motor subscore is 6.  Skin: Skin is warm, dry and intact. No rash noted. No cyanosis.  Psychiatric: She has a normal mood and affect. Her speech is normal and behavior is normal. Thought content normal.  Nursing note and vitals reviewed.    ED Treatments / Results  Labs (all labs ordered  are listed, but only abnormal results are displayed) Labs Reviewed  URINALYSIS, ROUTINE W REFLEX MICROSCOPIC - Abnormal; Notable for the following components:      Result Value   APPearance HAZY (*)    Specific Gravity, Urine 1.032 (*)    All other components within normal limits  COMPREHENSIVE METABOLIC PANEL - Abnormal; Notable for the following components:   Glucose, Bld 112 (*)    All other components within normal limits  CBC WITH DIFFERENTIAL/PLATELET - Abnormal; Notable for the following components:   WBC 10.6 (*)  All other components within normal limits  I-STAT BETA HCG BLOOD, ED ([facility], [facility], [facility] ONLY) - Abnormal; Notable for the following components:   I-stat hCG, quantitative 45.8 (*)    All other components within normal limits  LIPASE, BLOOD  I-STAT BETA HCG BLOOD, ED ([facility], [facility], [facility] ONLY)  I-STAT CG4 LACTIC ACID, ED    EKG None  Radiology Ct Abdomen Pelvis W Contrast  Result Date: 05/04/2018 CLINICAL DATA:  47 year old female with abdominal pain. EXAM: CT ABDOMEN AND PELVIS WITH CONTRAST TECHNIQUE: Multidetector CT imaging of the abdomen and pelvis was performed using the standard protocol following bolus administration of intravenous contrast. CONTRAST:  173mL OMNIPAQUE IOHEXOL 300 MG/ML  SOLN COMPARISON:  CT of the abdomen pelvis dated 11/14/2014 FINDINGS: Lower chest: Minimal bibasilar dependent atelectasis. No intra-abdominal free air or free fluid. Hepatobiliary: No focal liver abnormality is seen. No gallstones, gallbladder wall thickening, or biliary dilatation. Pancreas: Unremarkable. No pancreatic ductal dilatation or surrounding inflammatory changes. Spleen: Normal in size without focal abnormality. Adrenals/Urinary Tract: The adrenal glands are unremarkable. There is a 15 mm nonobstructing stone in the inferior pole of the left kidney. No hydronephrosis. Mild left renal parenchyma atrophy and cortical irregularity. Bilateral renal hypodense lesions are not characterized  but likely represent cysts. There is symmetric enhancement and excretion of contrast by both kidneys. The visualized ureters appear unremarkable. The urinary bladder is predominantly collapsed. Stomach/Bowel: Postsurgical changes of partial sigmoid resection with anastomotic suture. There is no bowel obstruction or active inflammation. Appendectomy. Vascular/Lymphatic: No significant vascular findings are present. No enlarged abdominal or pelvic lymph nodes. Reproductive: Hysterectomy. No pelvic mass. Other: None Musculoskeletal: No acute or significant osseous findings. IMPRESSION: 1. No acute intra-abdominal or pelvic pathology. No bowel obstruction or active inflammation. 2. Nonobstructing 15 mm stone in the inferior pole of the left kidney. No hydronephrosis. Electronically Signed   By: Anner Crete M.D.   On: 05/04/2018 02:23    Procedures Procedures (including critical care time)  Medications Ordered in ED Medications  ondansetron (ZOFRAN) injection 4 mg (4 mg Intravenous Not Given 05/04/18 0224)  HYDROmorphone (DILAUDID) injection 1 mg (1 mg Intravenous Given 05/04/18 0116)  ondansetron (ZOFRAN) injection 4 mg (4 mg Intravenous Given 05/04/18 0116)  iohexol (OMNIPAQUE) 300 MG/ML solution 100 mL (100 mLs Intravenous Contrast Given 05/04/18 0153)  ondansetron (ZOFRAN) injection 4 mg (4 mg Intravenous Given 05/04/18 0224)  sodium chloride 0.9 % bolus 1,000 mL (1,000 mLs Intravenous New Bag/Given 05/04/18 0321)  metoCLOPramide (REGLAN) injection 10 mg (10 mg Intravenous Given 05/04/18 0323)     Initial Impression / Assessment and Plan / ED Course  I have reviewed the triage vital signs and the nursing notes.  Pertinent labs & imaging results that were available during my care of the patient were reviewed by me and considered in my medical decision making (see chart for details).     Patient presents to the emergency department with complaints of right flank pain.  Patient reports that she  has been experiencing increased back pain for 3 weeks.  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  Pain had previously been across the lower back, but now she is having increased, severe pain that radiates into the right groin into the right hip area.  She does have a history of kidney stones.  Patient now experiencing nausea and vomiting, suggesting the possibility of renal colic.  Patient did have right lower abdominal tenderness as well.  She is status post appendectomy.  Patient  underwent CT scan to further evaluate.  No acute pathology is noted.  Pain is likely secondary to her history of bulging disks and low back pain.  She does not have anything that would suggest acute cauda equina syndrome.  She did, however, report fever earlier tonight.  She is afebrile here but has been taking Tylenol.  Because of this I did suggest doing an MRI.  She does not have any risk factors for discitis or epidural abscess, but cannot completely rule out without imaging.  Patient has become frustrated because of the length of time she is here in the ER.  She has had a significant reaction to the Dilaudid with intractable nausea and vomiting.  She does not wish to undergo the MRI at this point, just wants to go home.  I offered multiple other options for treatment of her nausea, helping her get through the MRI, but she does not wish to pursue this at this time.  She does understand the reasoning for ordering the MRI and the risks of missing these entities.  She will contact her primary doctor for follow-up and possible outpatient MRI.  She can return to the ER anytime if she has worsening symptoms.  Final Clinical Impressions(s) / ED Diagnoses   Final diagnoses:  Right flank pain    ED Discharge Orders         Ordered    cyclobenzaprine (FLEXERIL) 10 MG tablet  3 times daily PRN     05/04/18 0434    ondansetron (ZOFRAN ODT) 4 MG disintegrating tablet  Every 8 hours PRN     05/04/18 0434           Orpah Greek, MD 05/04/18 [phone]

## 2018-05-28 ENCOUNTER — Other Ambulatory Visit: Payer: Self-pay | Admitting: Family Medicine

## 2018-05-28 DIAGNOSIS — M5441 Lumbago with sciatica, right side: Secondary | ICD-10-CM

## 2018-06-10 ENCOUNTER — Ambulatory Visit
Admission: RE | Admit: 2018-06-10 | Discharge: 2018-06-10 | Disposition: A | Discharge status: Home / Self Care | Payer: 59 | Source: Ambulatory Visit | Attending: Family Medicine | Admitting: Family Medicine

## 2018-06-10 DIAGNOSIS — M5441 Lumbago with sciatica, right side: Secondary | ICD-10-CM

## 2018-12-31 ENCOUNTER — Other Ambulatory Visit: Payer: Self-pay | Admitting: Family Medicine

## 2018-12-31 DIAGNOSIS — Z1231 Encounter for screening mammogram for malignant neoplasm of breast: Secondary | ICD-10-CM

## 2019-01-13 ENCOUNTER — Ambulatory Visit: Payer: 59

## 2020-03-21 IMAGING — MR MR KNEE*R* W/O CM
4 of 7 series · 24 of 40 positions shown · non-contrast
Comparison: None.

CLINICAL DATA: Felt a pop exercising 2 days ago. Pain and swelling
since then.

EXAM:
MRI OF THE RIGHT KNEE WITHOUT CONTRAST
TECHNIQUE: Multiplanar, multisequence MR imaging of the knee was performed. No
intravenous contrast was administered.

[Series 4: T1 · coronal · 4.0mm · 0.29mm/px · 5 of 29 slices shown]
[im 1/29]
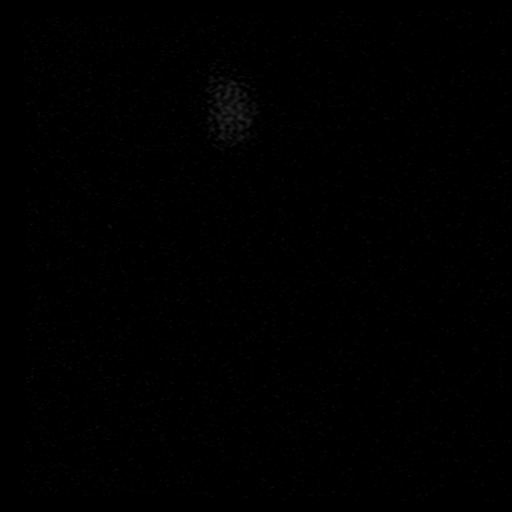
[im 6/29]
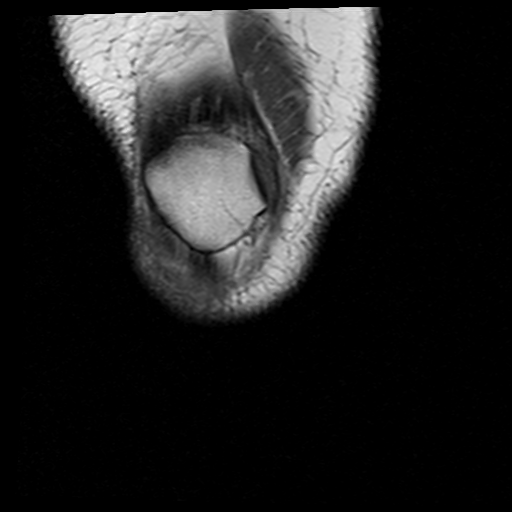
[im 12/29]
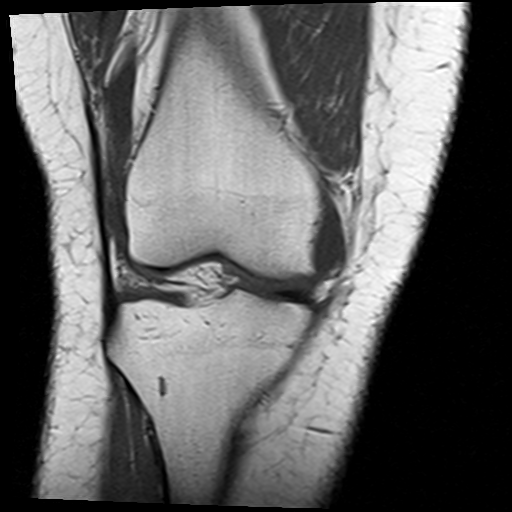
[im 17/29]
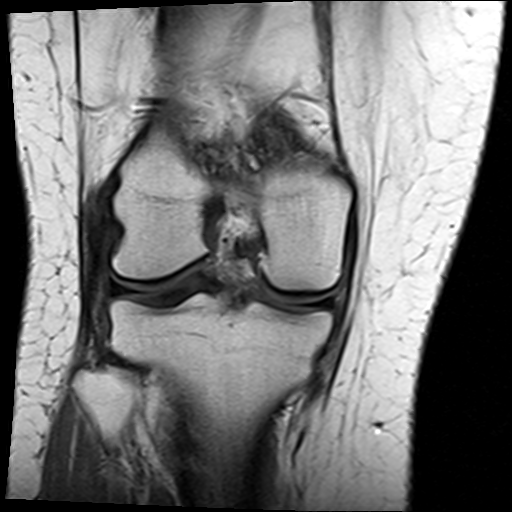
[im 29/29]
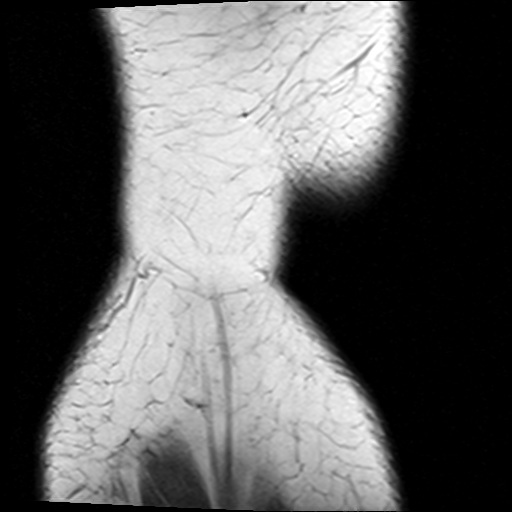

[Series 5: T2 fat-sat · coronal · 4.0mm · 0.59mm/px · 7 of 29 slices shown (1 of 2)]
[im 1/29]
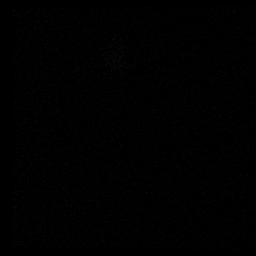
[im 5/29]
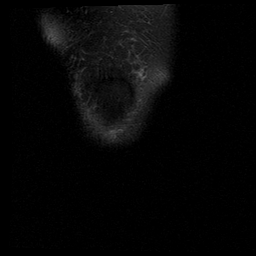
[im 10/29]
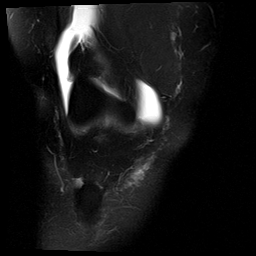
[im 15/29]
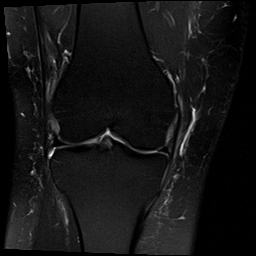
[im 19/29]
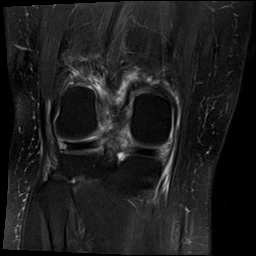
[im 24/29]
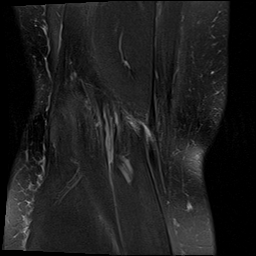
[im 29/29]
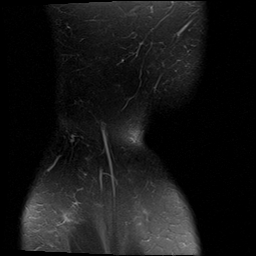

[Series 7: PD fat-sat · sagittal · 3.0mm · 0.29mm/px · 6 of 25 slices shown]
[im 1/25]
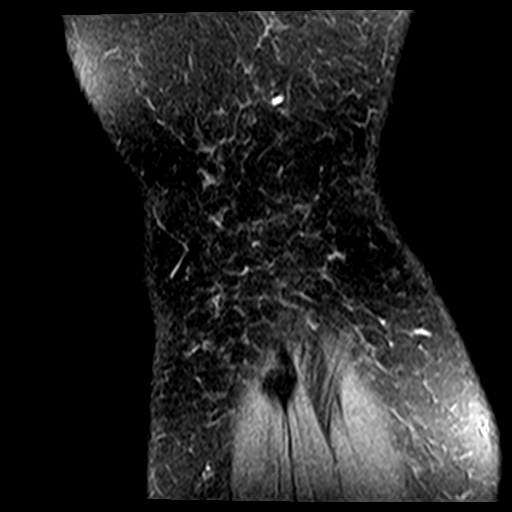
[im 5/25]
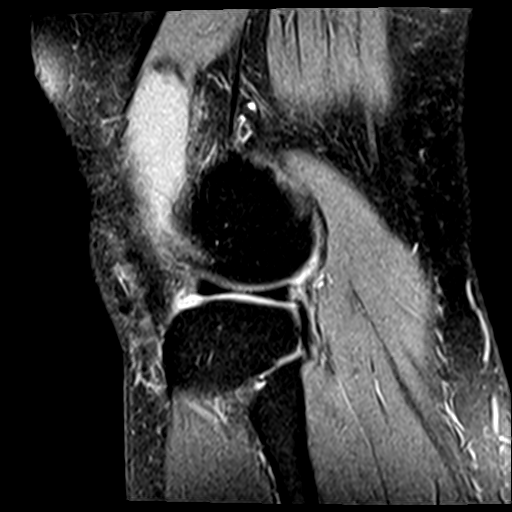
[im 10/25]
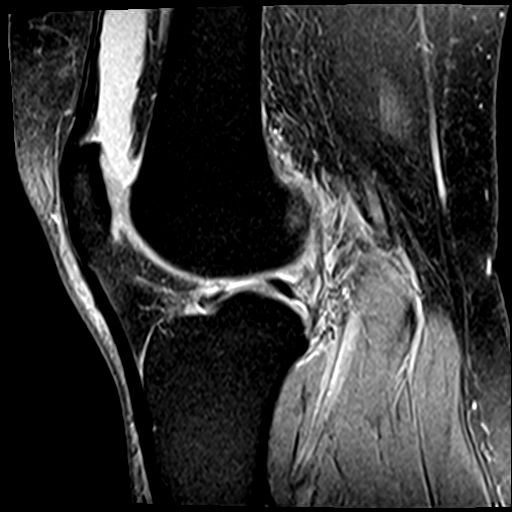
[im 15/25]
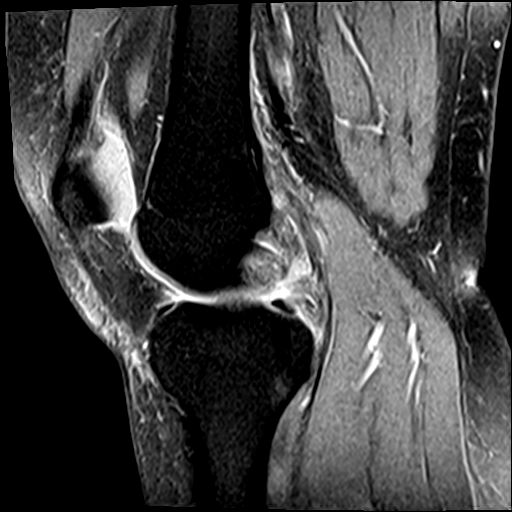
[im 20/25]
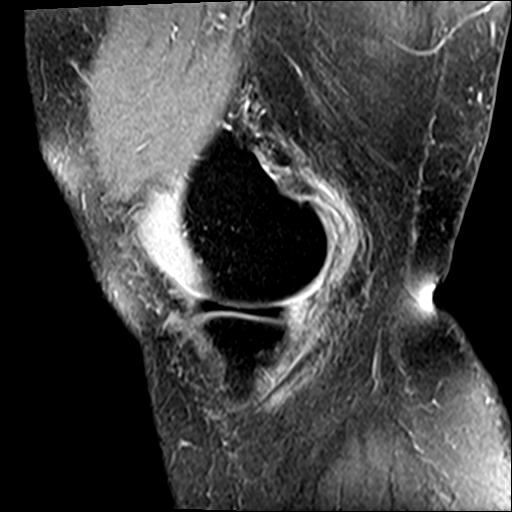
[im 25/25]
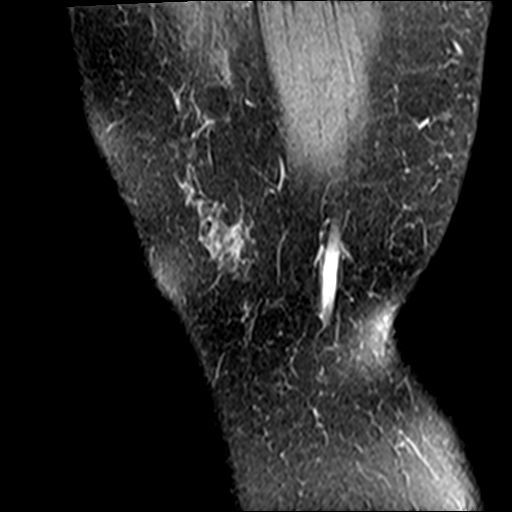

[Series 8: T2 fat-sat · sagittal · 3.0mm · 0.29mm/px · 6 of 25 slices shown (2 of 2)]
[im 1/25]
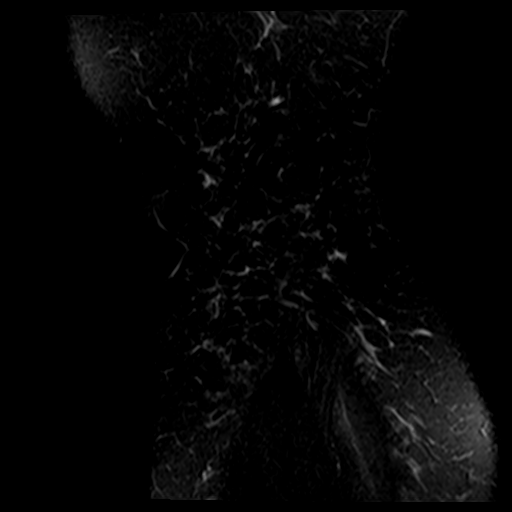
[im 5/25]
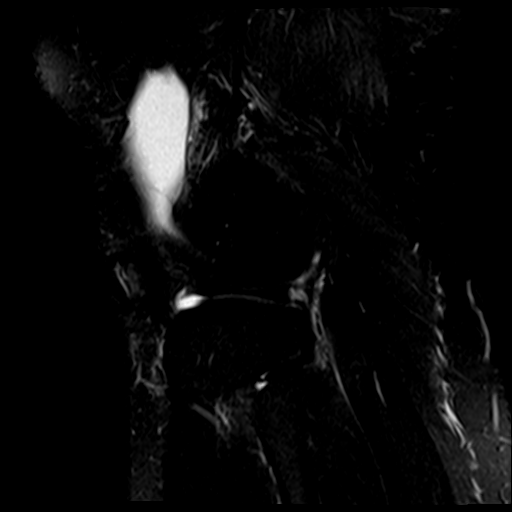
[im 10/25]
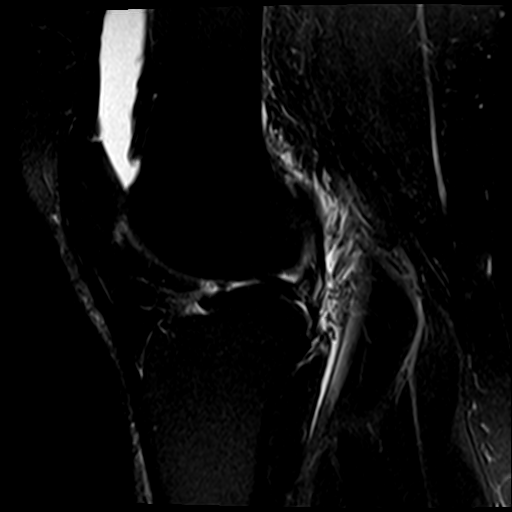
[im 15/25]
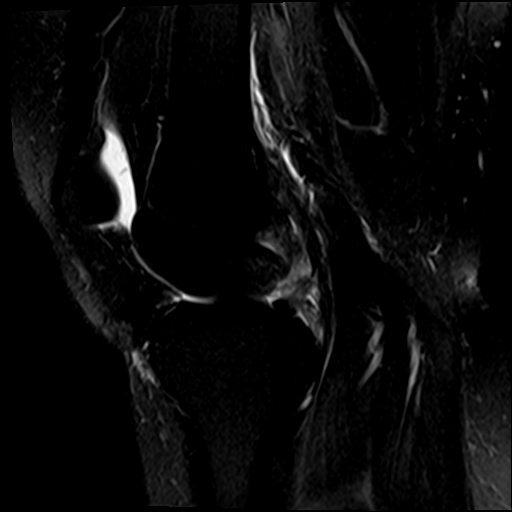
[im 20/25]
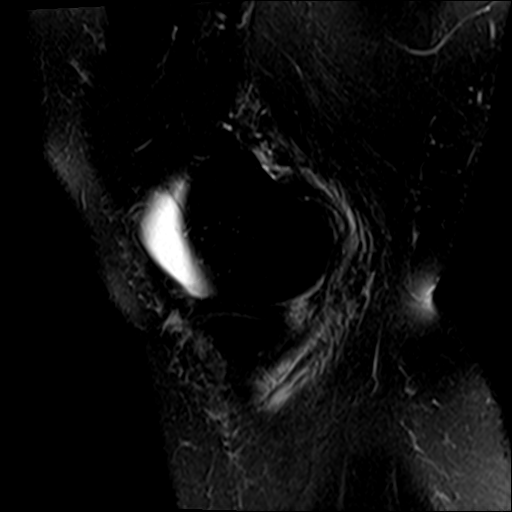
[im 25/25]
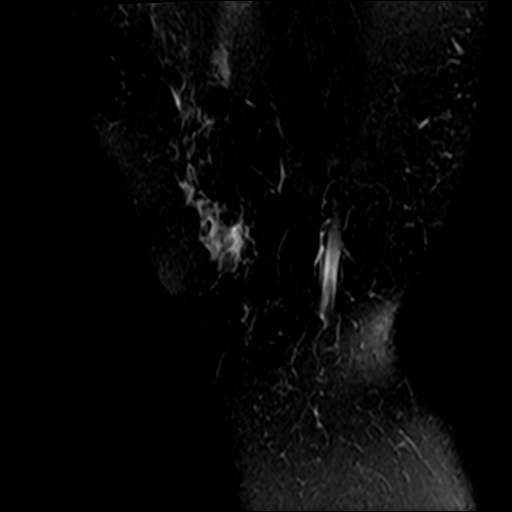

[24 of 40 positions shown; findings below may reference images not displayed]

FINDINGS: MENISCI

Medial meniscus:  Intact.

Lateral meniscus:  Intact.

LIGAMENTS

Cruciates:  Complete ACL tear.  Intact PCL.

Collaterals: Medial collateral ligament is intact. Mild increased
signal at the origin of the fibular collateral ligament most
concerning for ligament strain without a tear. Intact biceps femoris
and iliotibial band.

CARTILAGE

Patellofemoral:  No chondral defect.

Medial:  No chondral defect.

Lateral:  No chondral defect.

Joint: No plical thickening. Large joint effusion. Normal Hoffa's
fat.

Popliteal Fossa:  No Baker's cyst.  Intact popliteus tendon.

Extensor Mechanism: Intact quadriceps tendon. Intact patellar
tendon. Intact medial patellar retinaculum. Intact lateral patellar
retinaculum. Intact MPFL.

Bones:  No acute osseous abnormality.  No aggressive osseous lesion.

Other: No fluid collection or hematoma.
IMPRESSION: 1. Complete ACL tear.
2. Mild ligament strain of the proximal fibular collateral ligament.

## 2021-02-05 DIAGNOSIS — Z0289 Encounter for other administrative examinations: Secondary | ICD-10-CM

## 2021-02-15 ENCOUNTER — Encounter (INDEPENDENT_AMBULATORY_CARE_PROVIDER_SITE_OTHER): Payer: Self-pay | Admitting: Family Medicine

## 2021-02-15 ENCOUNTER — Other Ambulatory Visit: Payer: Self-pay

## 2021-02-15 ENCOUNTER — Ambulatory Visit (INDEPENDENT_AMBULATORY_CARE_PROVIDER_SITE_OTHER): Payer: No Typology Code available for payment source | Admitting: Family Medicine

## 2021-02-15 VITALS — BP 153/92 | HR 65 | Temp 98.0°F | Ht 64.0 in | Wt 220.0 lb

## 2021-02-15 DIAGNOSIS — R7301 Impaired fasting glucose: Secondary | ICD-10-CM

## 2021-02-15 DIAGNOSIS — E66812 Obesity, class 2: Secondary | ICD-10-CM

## 2021-02-15 DIAGNOSIS — M545 Low back pain, unspecified: Secondary | ICD-10-CM

## 2021-02-15 DIAGNOSIS — Z1331 Encounter for screening for depression: Secondary | ICD-10-CM | POA: Diagnosis not present

## 2021-02-15 DIAGNOSIS — R5383 Other fatigue: Secondary | ICD-10-CM | POA: Diagnosis not present

## 2021-02-15 DIAGNOSIS — K5909 Other constipation: Secondary | ICD-10-CM

## 2021-02-15 DIAGNOSIS — Z6837 Body mass index (BMI) 37.0-37.9, adult: Secondary | ICD-10-CM

## 2021-02-15 DIAGNOSIS — D509 Iron deficiency anemia, unspecified: Secondary | ICD-10-CM

## 2021-02-15 DIAGNOSIS — R0602 Shortness of breath: Secondary | ICD-10-CM

## 2021-02-15 DIAGNOSIS — Z87442 Personal history of urinary calculi: Secondary | ICD-10-CM

## 2021-02-15 DIAGNOSIS — R948 Abnormal results of function studies of other organs and systems: Secondary | ICD-10-CM

## 2021-02-15 DIAGNOSIS — E559 Vitamin D deficiency, unspecified: Secondary | ICD-10-CM

## 2021-02-15 DIAGNOSIS — Z9989 Dependence on other enabling machines and devices: Secondary | ICD-10-CM

## 2021-02-15 DIAGNOSIS — G4733 Obstructive sleep apnea (adult) (pediatric): Secondary | ICD-10-CM

## 2021-02-15 DIAGNOSIS — N3010 Interstitial cystitis (chronic) without hematuria: Secondary | ICD-10-CM

## 2021-02-15 DIAGNOSIS — J45990 Exercise induced bronchospasm: Secondary | ICD-10-CM

## 2021-02-15 DIAGNOSIS — G8929 Other chronic pain: Secondary | ICD-10-CM

## 2021-02-15 DIAGNOSIS — R600 Localized edema: Secondary | ICD-10-CM

## 2021-02-15 DIAGNOSIS — K219 Gastro-esophageal reflux disease without esophagitis: Secondary | ICD-10-CM

## 2021-02-15 DIAGNOSIS — Z9189 Other specified personal risk factors, not elsewhere classified: Secondary | ICD-10-CM | POA: Diagnosis not present

## 2021-02-15 DIAGNOSIS — I10 Essential (primary) hypertension: Secondary | ICD-10-CM

## 2021-02-15 DIAGNOSIS — Z85820 Personal history of malignant melanoma of skin: Secondary | ICD-10-CM

## 2021-02-15 NOTE — Progress Notes (Signed)
Chief Complaint:   OBESITY Samantha Castro (MR# [id number]) is a 50 y.o. female who presents for evaluation and treatment of obesity and related comorbidities. Current BMI is Body mass index is 37.76 kg/m. Samantha Castro has been struggling with her weight for many years and has been unsuccessful in either losing weight, maintaining weight loss, or reaching her healthy weight goal.  Samantha Castro is currently in the action stage of change and ready to dedicate time achieving and maintaining a healthier weight. Samantha Castro is interested in becoming our patient and working on intensive lifestyle modifications including (but not limited to) diet and exercise for weight loss.  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  She is married and lives with her husband and son.  She gets an average of <1000 steps per day.  Drinks regular sodas (stopped last week), tea, coffee with cream and sugar.    Samantha Castro's habits were reviewed today and are as follows: Her family eats meals together, her desired weight loss is 45 pounds, she started gaining weight after colon surgery and hysterectomy in 2012, her heaviest weight ever was 223 pounds, she craves fruit, salad, and sweets, she snacks frequently in the evenings, she skips lunch frequently, she is frequently drinking liquids with calories, she frequently makes poor food choices, she frequently eats larger portions than normal, and she struggles with emotional eating.  Depression Screen Samantha Castro's Food and Mood (modified PHQ-9) score was 23.  Depression screen PHQ 2/9 02/15/2021  Decreased Interest 3  Down, Depressed, Hopeless 3  PHQ - 2 Score 6  Altered sleeping 3  Tired, decreased energy 3  Change in appetite 3  Feeling bad or failure about yourself  3  Trouble concentrating 1  Moving slowly or fidgety/restless 3  Suicidal thoughts 1  PHQ-9 Score 23  Difficult doing work/chores Not difficult at all   Assessment/Plan:   1. Other fatigue Tawania admits to  daytime somnolence and reports waking up still tired. Patent has a history of symptoms of daytime fatigue, morning fatigue, morning headache, and snoring. Arlana generally gets 6 or 7 hours of sleep per night, and states that she has poor quality sleep. Snoring is present. Apneic episodes are present. Epworth Sleepiness Score is 16.  Daesha has OSA and uses a CPAP machine.  Samantha Castro does feel that her weight is causing her energy to be lower than it should be. Fatigue may be related to obesity, depression or many other causes. Labs will be ordered, and in the meanwhile, Samantha Castro will focus on self care including making healthy food choices, increasing physical activity and focusing on stress reduction.  - EKG 12-Lead - Anemia panel - CBC with Differential/Platelet - Comprehensive metabolic panel - Hemoglobin A1c - Insulin, random - Lipid panel - TSH - T4, free  2. SOB (shortness of breath) on exertion Samantha Castro notes increasing shortness of breath with exercising and seems to be worsening over time with weight gain. She notes getting out of breath sooner with activity than she used to. This has gotten worse recently. Samantha Castro denies shortness of breath at rest or orthopnea.  Tazmin does feel that she gets out of breath more easily that she used to when she exercises. Renad's shortness of breath appears to be obesity related and exercise induced. She has agreed to work on weight loss and gradually increase exercise to treat her exercise induced shortness of breath. Will continue to monitor closely.  - Anemia panel - CBC with Differential/Platelet - Comprehensive  metabolic panel - Hemoglobin A1c - Insulin, random - Lipid panel - TSH - T4, free  3. Abnormal metabolism Lil's metabolism is slower than normal.  Check labs today, as per below.  - Anemia panel - CBC with Differential/Platelet - Comprehensive metabolic panel - Hemoglobin A1c - Insulin, random - Lipid panel - TSH - T4,  free  4. Gastroesophageal reflux disease Samantha Castro takes Protonix 40 mg daily for GERD. The current medical regimen is effective;  continue present plan and medications.  5. Interstitial cystitis Samantha Castro takes diclofenac, Macrobid, Uribel, oxybutynin, and hydroxyzine as needed.  6. Essential hypertension Elevated. Medications: amlodipine 5 mg daily.   Plan: Avoid buying foods that are: processed, frozen, or prepackaged to avoid excess salt. We will watch for signs of hypotension as she continues lifestyle modifications.  BP Readings from Last 3 Encounters:  02/15/21 (!) 153/92  05/04/18 (!) 158/95  12/29/16 129/77   Lab Results  Component Value Date   CREATININE 0.88 05/03/2018   7. Chronic constipation Samantha Castro takes Linzess 145 mcg daily.  8. OSA on CPAP Samantha Castro uses a CPAP machine nightly.  OSA is a cause of systemic hypertension and is associated with an increased incidence of stroke, heart failure, atrial fibrillation, and coronary heart disease. Severe OSA increases all-cause mortality and cardiovascular mortality.   Goal: Treatment of OSA via CPAP compliance and weight loss. Plasma ghrelin levels (appetite or "hunger hormone") are significantly higher in OSA patients than in BMI-matched controls, but decrease to levels similar to those of obese patients without OSA after CPAP treatment.  Weight loss improves OSA by several mechanisms, including reduction in fatty tissue in the throat (i.e. parapharyngeal fat) and the tongue. Loss of abdominal fat increases mediastinal traction on the upper airway making it less likely to collapse during sleep. Studies have also shown that compliance with CPAP treatment improves leptin (hunger inhibitory hormone) imbalance.  9. Iron deficiency anemia, unspecified iron deficiency anemia type Nutrition: Iron-rich foods include dark leafy greens, red and white meats, eggs, seafood, and beans.  Certain foods and drinks prevent your body from absorbing  iron properly. Avoid eating these foods in the same meal as iron-rich foods or with iron supplements. These foods include: coffee, black tea, and red wine; milk, dairy products, and foods that are high in calcium; beans and soybeans; whole grains. Constipation can be a side effect of iron supplementation. Increased water and fiber intake are helpful. Water goal: > 2 liters/day. Fiber goal: > 25 grams/day.  10. Exercise-induced asthma Will continue to follow along as it relates to her weight loss journey.  11. Fasting hyperglycemia Will check CMP, A1c, and insulin level today, as per below.  - Comprehensive metabolic panel - Hemoglobin A1c - Insulin, random  12. Chronic low back pain Samantha Castro has history of ruptured discs.  13. History of kidney stones Samantha Castro is followed by Urogynecology.  Will follow along as it relates to her weigh loss journey.  14. Lower extremity edema Will check labs today.  15. History of melanoma Excision in 2018.  16. Vitamin D deficiency Will check vitamin D level today, as per below.  - VITAMIN D 25 Hydroxy (Vit-D Deficiency, Fractures)  17. Depression screening Samantha Castro was screened for depression as part of her new patient workup today.  PHQ-9 is 23.  Samantha Castro had a positive depression screening. Depression is commonly associated with obesity and often results in emotional eating behaviors. We will monitor this closely and work on CBT to help improve the non-hunger eating  patterns. Referral to Psychology may be required if no improvement is seen as she continues in our clinic.  18. At risk for heart disease Due to Samantha Castro's current state of health and medical condition(s), she is at a higher risk for heart disease.  This puts the patient at much greater risk to subsequently develop cardiopulmonary conditions that can significantly affect patient's quality of life in a negative manner.    At least 8 minutes were spent on counseling Samantha Castro about these concerns  today. Evidence-based interventions for health behavior change were utilized today including the discussion of self monitoring techniques, problem-solving barriers, and SMART goal setting techniques.  Specifically, regarding patient's less desirable eating habits and patterns, we employed the technique of small changes when Samantha Castro has not been able to fully commit to her prudent nutritional plan.  19. Class 2 severe obesity with serious comorbidity and body mass index (BMI) of 37.0 to 37.9 in adult, unspecified obesity type (HCC)  Samantha Castro is currently in the action stage of change and her goal is to continue with weight loss efforts. I recommend Samantha Castro begin the structured treatment plan as follows:  She has agreed to the Category 1 Plan.  Exercise goals:  As is.    Behavioral modification strategies: increasing lean protein intake, decreasing simple carbohydrates, increasing vegetables, increasing water intake, decreasing liquid calories, and decreasing sodium intake.  She was informed of the importance of frequent follow-up visits to maximize her success with intensive lifestyle modifications for her multiple health conditions. She was informed we would discuss her lab results at her next visit unless there is a critical issue that needs to be addressed sooner. Liliannah agreed to keep her next visit at the agreed upon time to discuss these results.  Objective:   Blood pressure (!) 153/92, pulse 65, temperature 98 F (36.7 C), temperature source Oral, height '5\' 4"'$  (1.626 m), weight 220 lb (99.8 kg), SpO2 96 %. Body mass index is 37.76 kg/m.  EKG: Normal sinus rhythm, rate 65 bpm.  Indirect Calorimeter completed today shows a VO2 of 218 and a REE of 1498.  Her calculated basal metabolic rate is [id number] thus her basal metabolic rate is worse than expected.  General: Cooperative, alert, well developed, in no acute distress. HEENT: Conjunctivae and lids unremarkable. Cardiovascular: Regular rhythm.   Lungs: Normal work of breathing. Neurologic: No focal deficits.   Lab Results  Component Value Date   CREATININE 0.88 05/03/2018   BUN 20 05/03/2018   NA 138 05/03/2018   K 3.6 05/03/2018   CL 102 05/03/2018   CO2 26 05/03/2018   Lab Results  Component Value Date   ALT 30 05/03/2018   AST 22 05/03/2018   ALKPHOS 87 05/03/2018   BILITOT 0.5 05/03/2018   Lab Results  Component Value Date   TSH 0.92 10/16/2006   Lab Results  Component Value Date   WBC 10.6 (H) 05/03/2018   HGB 13.3 05/03/2018   HCT 40.1 05/03/2018   MCV 90.7 05/03/2018   PLT 353 05/03/2018   Attestation Statements:   This is the patient's first visit at [facility]. The patient's NEW PATIENT PACKET was reviewed at length. Included in the packet: current and past health history, medications, allergies, ROS, gynecologic history (women only), surgical history, family history, social history, weight history, weight loss surgery history (for those that have had weight loss surgery), nutritional evaluation, mood and food questionnaire, PHQ9, Epworth questionnaire, sleep habits questionnaire, patient life and health improvement goals questionnaire.  These will all be scanned into the patient's chart under media.   During the visit, I independently reviewed the patient's EKG, bioimpedance scale results, and indirect calorimeter results. I used this information to tailor a meal plan for the patient that will help her to lose weight and will improve her obesity-related conditions going forward. I performed a medically necessary appropriate examination and/or evaluation. I discussed the assessment and treatment plan with the patient. The patient was provided an opportunity to ask questions and all were answered. The patient agreed with the plan and demonstrated an understanding of the instructions. Labs were ordered at this visit and will be reviewed at the next visit unless more critical results need to be  addressed immediately. Clinical information was updated and documented in the EMR.   I, Water quality scientist, CMA, am acting as transcriptionist for Briscoe Deutscher, DO  I have reviewed the above documentation for accuracy and completeness, and I agree with the above. Briscoe Deutscher, DO

## 2021-02-16 LAB — CBC WITH DIFFERENTIAL/PLATELET
Basophils Absolute: 0.1 10*3/uL (ref 0.0–0.2)
Basos: 1 %
EOS (ABSOLUTE): 0.3 10*3/uL (ref 0.0–0.4)
Eos: 3 %
Hemoglobin: 13.2 g/dL (ref 11.1–15.9)
Immature Grans (Abs): 0 10*3/uL (ref 0.0–0.1)
Immature Granulocytes: 0 %
Lymphocytes Absolute: 2 10*3/uL (ref 0.7–3.1)
Lymphs: 27 %
MCH: 29.7 pg (ref 26.6–33.0)
MCHC: 33.8 g/dL (ref 31.5–35.7)
MCV: 88 fL (ref 79–97)
Monocytes Absolute: 0.4 10*3/uL (ref 0.1–0.9)
Monocytes: 6 %
Neutrophils Absolute: 4.7 10*3/uL (ref 1.4–7.0)
Neutrophils: 63 %
Platelets: 325 10*3/uL (ref 150–450)
RBC: 4.44 x10E6/uL (ref 3.77–5.28)
RDW: 14.5 % (ref 11.7–15.4)
WBC: 7.5 10*3/uL (ref 3.4–10.8)

## 2021-02-16 LAB — ANEMIA PANEL
Ferritin: 74 ng/mL (ref 15–150)
Hematocrit: 39 % (ref 34.0–46.6)
Iron Saturation: 18 % (ref 15–55)
Iron: 50 ug/dL (ref 27–159)
Retic Ct Pct: 2.5 % (ref 0.6–2.6)
Total Iron Binding Capacity: 284 ug/dL (ref 250–450)
UIBC: 234 ug/dL (ref 131–425)
Vitamin B-12: 871 pg/mL (ref 232–1245)

## 2021-02-16 LAB — COMPREHENSIVE METABOLIC PANEL
ALT: 23 IU/L (ref 0–32)
AST: 21 IU/L (ref 0–40)
Albumin/Globulin Ratio: 1.7 (ref 1.2–2.2)
Albumin: 4.7 g/dL (ref 3.8–4.8)
Alkaline Phosphatase: 112 IU/L (ref 44–121)
BUN/Creatinine Ratio: 22 (ref 9–23)
BUN: 20 mg/dL (ref 6–24)
Bilirubin Total: 0.3 mg/dL (ref 0.0–1.2)
CO2: 24 mmol/L (ref 20–29)
Calcium: 9.4 mg/dL (ref 8.7–10.2)
Chloride: 100 mmol/L (ref 96–106)
Creatinine, Ser: 0.92 mg/dL (ref 0.57–1.00)
Globulin, Total: 2.8 g/dL (ref 1.5–4.5)
Glucose: 112 mg/dL — ABNORMAL HIGH (ref 65–99)
Potassium: 4.3 mmol/L (ref 3.5–5.2)
Sodium: 139 mmol/L (ref 134–144)
Total Protein: 7.5 g/dL (ref 6.0–8.5)
eGFR: 76 mL/min/{1.73_m2} (ref >59)

## 2021-02-16 LAB — VITAMIN D 25 HYDROXY (VIT D DEFICIENCY, FRACTURES): Vit D, 25-Hydroxy: 42.7 ng/mL (ref 30.0–100.0)

## 2021-02-16 LAB — LIPID PANEL
Chol/HDL Ratio: 3.7 ratio (ref 0.0–4.4)
Cholesterol, Total: 212 mg/dL — ABNORMAL HIGH (ref 100–199)
HDL: 58 mg/dL (ref >39)
LDL Chol Calc (NIH): 139 mg/dL — ABNORMAL HIGH (ref 0–99)
Triglycerides: 85 mg/dL (ref 0–149)
VLDL Cholesterol Cal: 15 mg/dL (ref 5–40)

## 2021-02-16 LAB — HEMOGLOBIN A1C
Est. average glucose Bld gHb Est-mCnc: 131 mg/dL
Hgb A1c MFr Bld: 6.2 % — ABNORMAL HIGH (ref 4.8–5.6)

## 2021-02-16 LAB — SPECIMEN STATUS REPORT

## 2021-02-16 LAB — T4, FREE: Free T4: 1.35 ng/dL (ref 0.82–1.77)

## 2021-02-16 LAB — INSULIN, RANDOM: INSULIN: 10.8 u[IU]/mL (ref 2.6–24.9)

## 2021-02-16 LAB — TSH: TSH: 1.36 u[IU]/mL (ref 0.450–4.500)

## 2021-02-28 ENCOUNTER — Other Ambulatory Visit: Payer: Self-pay

## 2021-02-28 ENCOUNTER — Encounter (INDEPENDENT_AMBULATORY_CARE_PROVIDER_SITE_OTHER): Payer: Self-pay | Admitting: Family Medicine

## 2021-02-28 ENCOUNTER — Ambulatory Visit (INDEPENDENT_AMBULATORY_CARE_PROVIDER_SITE_OTHER): Payer: No Typology Code available for payment source | Admitting: Family Medicine

## 2021-02-28 VITALS — BP 168/81 | HR 73 | Temp 98.1°F | Ht 64.0 in | Wt 216.0 lb

## 2021-02-28 DIAGNOSIS — I10 Essential (primary) hypertension: Secondary | ICD-10-CM

## 2021-02-28 DIAGNOSIS — R7303 Prediabetes: Secondary | ICD-10-CM

## 2021-02-28 DIAGNOSIS — Z6837 Body mass index (BMI) 37.0-37.9, adult: Secondary | ICD-10-CM

## 2021-02-28 DIAGNOSIS — E7800 Pure hypercholesterolemia, unspecified: Secondary | ICD-10-CM | POA: Diagnosis not present

## 2021-02-28 MED ORDER — TIRZEPATIDE 2.5 MG/0.5ML ~~LOC~~ SOAJ: 2.5000 mg | SUBCUTANEOUS | 0 refills | Status: DC

## 2021-02-28 NOTE — Progress Notes (Signed)
Chief Complaint:   OBESITY Samantha Castro is here to discuss her progress with her obesity treatment plan along with follow-up of her obesity related diagnoses.   Today's visit was #: 2 Starting weight: 220 lbs Starting date: 02/15/2021 Today's weight: 216 lbs Today's date: 02/28/2021 Weight change since last visit: 4 lbs Total lbs lost to date: 4 lbs Body mass index is 37.08 kg/m.  Total weight loss percentage to date: -1.82%  Current Meal Plan: the Category 1 Plan for 100% of the time.  Current Exercise Plan: Walking 2.5-3 miles for 30 minutes 3-4 times per week.  Interim History:  Gabriana has done well over the past 2 weeks. She has followed her meal plan diligently, logged all of her food as well as exercise, and monitored blood sugars. She read more about Mounjaro and is interested in trying it.  Assessment/Plan:   1. Prediabetes Not at goal. Goal is HgbA1c < 5.7.  Medication: None.    Plan:  Start Mounjaro 2.5 mg subcutaneously weekly.  She will continue to focus on protein-rich, low simple carbohydrate foods. We reviewed the importance of hydration, regular exercise for stress reduction, and restorative sleep.   Lab Results  Component Value Date   HGBA1C 6.2 (H) 02/15/2021   Lab Results  Component Value Date   INSULIN 10.8 02/15/2021   - Start tirzepatide [facility]) 2.5 MG/0.5ML Pen; Inject 2.5 mg into the skin once a week.  Dispense: 2 mL; Refill: 0  2. Pure hypercholesterolemia Course: Not at goal. Lipid-lowering medications: None.   Plan: Dietary changes: Increase soluble fiber, decrease simple carbohydrates, decrease saturated fat. Exercise changes: Moderate to vigorous-intensity aerobic activity 150 minutes per week or as tolerated. We will continue to monitor along with PCP/specialists as it pertains to her weight loss journey.  Lab Results  Component Value Date   CHOL 212 (H) 02/15/2021   HDL 58 02/15/2021   LDLCALC 139 (H) 02/15/2021   TRIG 85 02/15/2021    CHOLHDL 3.7 02/15/2021   Lab Results  Component Value Date   ALT 23 02/15/2021   AST 21 02/15/2021   ALKPHOS 112 02/15/2021   BILITOT 0.3 02/15/2021   The 10-year ASCVD risk score (Arnett DK, et al., 2019) is: 2.9%   Values used to calculate the score:     Age: 50 years     Sex: Female     Is Non-Hispanic African American: No     Diabetic: No     Tobacco smoker: No     Systolic Blood Pressure: 093 mmHg     Is BP treated: Yes     HDL Cholesterol: 58 mg/dL     Total Cholesterol: 212 mg/dL  3. Essential hypertension Elevated. Medications: Norvasc 5 mg daily.   Plan: Avoid buying foods that are: processed, frozen, or prepackaged to avoid excess salt. We will continue to monitor symptoms as they relate to her weight loss journey.  BP Readings from Last 3 Encounters:  02/28/21 (!) 168/81  02/15/21 (!) 153/92  05/04/18 (!) 158/95   Lab Results  Component Value Date   CREATININE 0.92 02/15/2021   4. Obesity, current BMI 37.2  Course: Samantha Castro is currently in the action stage of change. As such, her goal is to continue with weight loss efforts.   Nutrition goals: She has agreed to the Category 1 Plan.   Exercise goals:  Increase NEAT.  Behavioral modification strategies: increasing lean protein intake, decreasing simple carbohydrates, increasing vegetables, and increasing water intake.  Ivin Booty  has agreed to follow-up with our clinic in 3 weeks. She was informed of the importance of frequent follow-up visits to maximize her success with intensive lifestyle modifications for her multiple health conditions.   Objective:   Blood pressure (!) 168/81, pulse 73, temperature 98.1 F (36.7 C), temperature source Oral, height 5\' 4"  (1.626 m), weight 216 lb (98 kg), SpO2 96 %. Body mass index is 37.08 kg/m.  General: Cooperative, alert, well developed, in no acute distress. HEENT: Conjunctivae and lids unremarkable. Cardiovascular: Regular rhythm.  Lungs: Normal work of  breathing. Neurologic: No focal deficits.   Lab Results  Component Value Date   CREATININE 0.92 02/15/2021   BUN 20 02/15/2021   NA 139 02/15/2021   K 4.3 02/15/2021   CL 100 02/15/2021   CO2 24 02/15/2021   Lab Results  Component Value Date   ALT 23 02/15/2021   AST 21 02/15/2021   ALKPHOS 112 02/15/2021   BILITOT 0.3 02/15/2021   Lab Results  Component Value Date   HGBA1C 6.2 (H) 02/15/2021   Lab Results  Component Value Date   INSULIN 10.8 02/15/2021   Lab Results  Component Value Date   TSH 1.360 02/15/2021   Lab Results  Component Value Date   CHOL 212 (H) 02/15/2021   HDL 58 02/15/2021   LDLCALC 139 (H) 02/15/2021   TRIG 85 02/15/2021   CHOLHDL 3.7 02/15/2021   Lab Results  Component Value Date   VD25OH 42.7 02/15/2021   Lab Results  Component Value Date   WBC 7.5 02/15/2021   HGB 13.2 02/15/2021   HCT 39.0 02/15/2021   MCV 88 02/15/2021   PLT 325 02/15/2021   Lab Results  Component Value Date   IRON 50 02/15/2021   TIBC 284 02/15/2021   FERRITIN 74 02/15/2021   Attestation Statements:   Reviewed by clinician on day of visit: allergies, medications, problem list, medical history, surgical history, family history, social history, and previous encounter notes.  I, Water quality scientist, CMA, am acting as transcriptionist for Briscoe Deutscher, DO  I have reviewed the above documentation for accuracy and completeness, and I agree with the above. Briscoe Deutscher, DO

## 2021-03-01 ENCOUNTER — Ambulatory Visit (INDEPENDENT_AMBULATORY_CARE_PROVIDER_SITE_OTHER): Payer: Self-pay | Admitting: Family Medicine

## 2021-03-04 ENCOUNTER — Encounter (INDEPENDENT_AMBULATORY_CARE_PROVIDER_SITE_OTHER): Payer: Self-pay

## 2021-03-05 ENCOUNTER — Encounter (INDEPENDENT_AMBULATORY_CARE_PROVIDER_SITE_OTHER): Payer: Self-pay | Admitting: Family Medicine

## 2021-03-06 NOTE — Telephone Encounter (Signed)
Please advise 

## 2021-03-21 ENCOUNTER — Encounter (INDEPENDENT_AMBULATORY_CARE_PROVIDER_SITE_OTHER): Payer: Self-pay | Admitting: Adult Health

## 2021-03-21 ENCOUNTER — Other Ambulatory Visit: Payer: Self-pay

## 2021-03-21 ENCOUNTER — Ambulatory Visit (INDEPENDENT_AMBULATORY_CARE_PROVIDER_SITE_OTHER): Payer: No Typology Code available for payment source | Admitting: Adult Health

## 2021-03-21 VITALS — BP 152/79 | HR 53 | Temp 98.2°F | Ht 64.0 in | Wt 212.0 lb

## 2021-03-21 DIAGNOSIS — Z9189 Other specified personal risk factors, not elsewhere classified: Secondary | ICD-10-CM | POA: Diagnosis not present

## 2021-03-21 DIAGNOSIS — Z6837 Body mass index (BMI) 37.0-37.9, adult: Secondary | ICD-10-CM | POA: Diagnosis not present

## 2021-03-21 DIAGNOSIS — R7303 Prediabetes: Secondary | ICD-10-CM | POA: Insufficient documentation

## 2021-03-21 DIAGNOSIS — I10 Essential (primary) hypertension: Secondary | ICD-10-CM | POA: Insufficient documentation

## 2021-03-21 MED ORDER — TIRZEPATIDE 2.5 MG/0.5ML ~~LOC~~ SOAJ: 2.5000 mg | SUBCUTANEOUS | 0 refills | Status: DC

## 2021-03-21 NOTE — Progress Notes (Signed)
Chief Complaint:   OBESITY Samantha Castro is here to discuss her progress with her obesity treatment plan along with follow-up of her obesity related diagnoses. Samantha Castro is on the Category 1 Plan and states she is following her eating plan approximately 100% of the time. Samantha Castro states she is going to the gym for 30 minutes 2-3 times per week.  Today's visit was #: 3 Starting weight: 220 lbs Starting date: 02/15/2021 Today's weight: 212 lbs Today's date: 03/21/2021 Total lbs lost to date: 8 lbs Total lbs lost since last in-office visit: 4 lbs  Interim History: Samantha Castro was started on Mounjaro 2.5 mg on 02/28/2021. She has had 2 doses of Mounjaro 2.5 mg - she has experienced nausea without vomiting, body aches, and poor appetite.  Of note:  Significant history of nephrolithiasis.  She has converted from 6 sodas per day to 6-7 bottles of water per day- excellent!  Subjective:   1. Prediabetes Samantha Castro was started on Mounjaro 2.5 mg on 02/28/2021. She has had 2 doses of Mounjaro 2.5 mg - she has experienced nausea without vomiting, body aches, and poor appetite. She denies mass in neck, dysphagia, dyspepsia, or persistent hoarseness.  2. Essential hypertension BP above goal. BP was also elevated at last office visit. She is on amlodipine 5 mg daily - been on for 6 months.  Previously on lisinopril - ACE cough.   She denies acute cardiac sx's at present. Ambulatory readings - SBP 130s, DBP 90s. Her mother/father both had hypertension- both deceased.  3. At risk for heart disease Samantha Castro is at a higher than average risk for cardiovascular disease due to hyperlipidemia, hypertension, prediabetes, and obesity.    Assessment/Plan:   1. Prediabetes Refill Mounjaro 2.5 mg once weekly, as per below.  - Refill tirzepatide (MOUNJARO) 2.5 MG/0.5ML Pen; Inject 2.5 mg into the skin once a week.  Dispense: 2 mL; Refill: 0  2. Essential hypertension Monitor BP.  3. At risk for heart disease Samantha Castro  was given approximately 15 minutes of coronary artery disease prevention counseling today. She is 50 y.o. female and has risk factors for heart disease including obesity. We discussed intensive lifestyle modifications today with an emphasis on specific weight loss instructions and strategies.   Repetitive spaced learning was employed today to elicit superior memory formation and behavioral change.   4. Obesity, current BMI 36.4  Samantha Castro is currently in the action stage of change. As such, her goal is to continue with weight loss efforts. She has agreed to the Category 1 Plan.   Exercise goals:  As is.  Behavioral modification strategies: increasing lean protein intake, decreasing simple carbohydrates, meal planning and cooking strategies, keeping healthy foods in the home, and planning for success.  Samantha Castro has agreed to follow-up with our clinic in 3 weeks. She was informed of the importance of frequent follow-up visits to maximize her success with intensive lifestyle modifications for her multiple health conditions.   Objective:   Blood pressure (!) 152/79, pulse (!) 53, temperature 98.2 F (36.8 C), height 5\' 4"  (1.626 m), weight 212 lb (96.2 kg), SpO2 97 %. Body mass index is 36.39 kg/m.  General: Cooperative, alert, well developed, in no acute distress. HEENT: Conjunctivae and lids unremarkable. Cardiovascular: Regular rhythm.  Lungs: Normal work of breathing. Neurologic: No focal deficits.   Lab Results  Component Value Date   CREATININE 0.92 02/15/2021   BUN 20 02/15/2021   NA 139 02/15/2021   K 4.3 02/15/2021   CL 100 02/15/2021  CO2 24 02/15/2021   Lab Results  Component Value Date   ALT 23 02/15/2021   AST 21 02/15/2021   ALKPHOS 112 02/15/2021   BILITOT 0.3 02/15/2021   Lab Results  Component Value Date   HGBA1C 6.2 (H) 02/15/2021   Lab Results  Component Value Date   INSULIN 10.8 02/15/2021   Lab Results  Component Value Date   TSH 1.360 02/15/2021    Lab Results  Component Value Date   CHOL 212 (H) 02/15/2021   HDL 58 02/15/2021   LDLCALC 139 (H) 02/15/2021   TRIG 85 02/15/2021   CHOLHDL 3.7 02/15/2021   Lab Results  Component Value Date   VD25OH 42.7 02/15/2021   Lab Results  Component Value Date   WBC 7.5 02/15/2021   HGB 13.2 02/15/2021   HCT 39.0 02/15/2021   MCV 88 02/15/2021   PLT 325 02/15/2021   Lab Results  Component Value Date   IRON 50 02/15/2021   TIBC 284 02/15/2021   FERRITIN 74 02/15/2021   Attestation Statements:   Reviewed by clinician on day of visit: allergies, medications, problem list, medical history, surgical history, family history, social history, and previous encounter notes.  I, Water quality scientist, CMA, am acting as Location manager for Mina Marble, NP.  I have reviewed the above documentation for accuracy and completeness, and I agree with the above. -  Anyae Griffith d. Evella Kasal, NP-C

## 2021-04-02 ENCOUNTER — Encounter (INDEPENDENT_AMBULATORY_CARE_PROVIDER_SITE_OTHER): Payer: Self-pay | Admitting: Family Medicine

## 2021-04-02 NOTE — Telephone Encounter (Signed)
Last OV with Katy 

## 2021-04-10 ENCOUNTER — Ambulatory Visit (INDEPENDENT_AMBULATORY_CARE_PROVIDER_SITE_OTHER): Payer: No Typology Code available for payment source | Admitting: Family Medicine

## 2021-04-10 ENCOUNTER — Other Ambulatory Visit: Payer: Self-pay

## 2021-04-10 ENCOUNTER — Encounter (INDEPENDENT_AMBULATORY_CARE_PROVIDER_SITE_OTHER): Payer: Self-pay | Admitting: Family Medicine

## 2021-04-10 VITALS — BP 139/82 | HR 65 | Temp 97.8°F | Ht 64.0 in | Wt 208.0 lb

## 2021-04-10 DIAGNOSIS — K5909 Other constipation: Secondary | ICD-10-CM | POA: Diagnosis not present

## 2021-04-10 DIAGNOSIS — K219 Gastro-esophageal reflux disease without esophagitis: Secondary | ICD-10-CM | POA: Diagnosis not present

## 2021-04-10 DIAGNOSIS — R7303 Prediabetes: Secondary | ICD-10-CM

## 2021-04-10 DIAGNOSIS — Z6837 Body mass index (BMI) 37.0-37.9, adult: Secondary | ICD-10-CM

## 2021-04-11 ENCOUNTER — Encounter (INDEPENDENT_AMBULATORY_CARE_PROVIDER_SITE_OTHER): Payer: Self-pay | Admitting: Family Medicine

## 2021-04-11 MED ORDER — TIRZEPATIDE 5 MG/0.5ML ~~LOC~~ SOAJ: 5.0000 mg | SUBCUTANEOUS | 0 refills | Status: DC

## 2021-04-11 NOTE — Progress Notes (Signed)
Chief Complaint:   OBESITY Samantha Castro is here to discuss her progress with her obesity treatment plan along with follow-up of her obesity related diagnoses. See Medical Weight Management Flowsheet for complete bioelectrical impedance results.  Today's visit was #: 4 Starting weight: 220 lbs Starting date: 02/15/2021 Weight change since last visit: 4 lbs Total lbs lost to date: 12 lbs Total weight loss percentage to date: -5.45%  Nutrition Plan: Category 1 Meal Plan for 95% of the time.  Activity: Walking/Beach Body for 25-45 minutes 3-4 times per week.  Anti-obesity medications: Mounjaro 2.5 mg subcutaneously weekly. Reported side effects: None.  Interim History: Reviewed last mychart messages and PCP visit notes together. Unfortunately, Samantha Castro had an acute onset of GI-related issues last month. She contacted the office and was given instructions to contact her PCP. Thankfully, her PCP were able to see her. Note: This should have been addressed by our office immediately and our staff were given specific instructions for any future events. Samantha Castro thinks that her previous abdominal issue was a viral illness because her symptoms resolved and because her family had similar symptoms. She says she never stopped Mounjaro.  Assessment/Plan:   1. Prediabetes Not at goal. Goal is HgbA1c < 5.7.  Medication: Mounjaro 2.5 mg subcutaneously weekly.    Plan:  Increase Mounjaro to 5 mg subcutaneously weekly, as per below. She will continue to focus on protein-rich, low simple carbohydrate foods. We reviewed the importance of hydration, regular exercise for stress reduction, and restorative sleep.   Lab Results  Component Value Date   HGBA1C 6.2 (H) 02/15/2021   Lab Results  Component Value Date   INSULIN 10.8 02/15/2021   - Increase tirzepatide (MOUNJARO) 5 MG/0.5ML Pen; Inject 5 mg into the skin once a week.  Dispense: 2 mL; Refill: 0  2. Chronic constipation Samantha Castro is taking Linzess 145 mcg  daily. This was held during her GI illness.  3. Gastroesophageal reflux disease She is taking Protonix 40 mg daily for her GERD symptoms. The current medical regimen is effective;  continue present plan and medications.  4. Obesity, current BMI 35.7  Course: Samantha Castro is currently in the action stage of change. As such, her goal is to continue with weight loss efforts.   Nutrition goals: She has agreed to the Category 1 Plan.   Exercise goals:  As is.  Behavioral modification strategies: increasing lean protein intake, decreasing simple carbohydrates, increasing vegetables, increasing water intake, and increasing high fiber foods.  Samantha Castro has agreed to follow-up with our clinic in 4 weeks. She was informed of the importance of frequent follow-up visits to maximize her success with intensive lifestyle modifications for her multiple health conditions.   Objective:   Blood pressure 139/82, pulse 65, temperature 97.8 F (36.6 C), temperature source Oral, height 5\' 4"  (1.626 m), weight 208 lb (94.3 kg), SpO2 97 %. Body mass index is 35.7 kg/m.  General: Cooperative, alert, well developed, in no acute distress. HEENT: Conjunctivae and lids unremarkable. Cardiovascular: Regular rhythm.  Lungs: Normal work of breathing. Neurologic: No focal deficits.   Lab Results  Component Value Date   CREATININE 0.92 02/15/2021   BUN 20 02/15/2021   NA 139 02/15/2021   K 4.3 02/15/2021   CL 100 02/15/2021   CO2 24 02/15/2021   Lab Results  Component Value Date   ALT 23 02/15/2021   AST 21 02/15/2021   ALKPHOS 112 02/15/2021   BILITOT 0.3 02/15/2021   Lab Results  Component Value Date  HGBA1C 6.2 (H) 02/15/2021   Lab Results  Component Value Date   INSULIN 10.8 02/15/2021   Lab Results  Component Value Date   TSH 1.360 02/15/2021   Lab Results  Component Value Date   CHOL 212 (H) 02/15/2021   HDL 58 02/15/2021   LDLCALC 139 (H) 02/15/2021   TRIG 85 02/15/2021   CHOLHDL 3.7  02/15/2021   Lab Results  Component Value Date   VD25OH 42.7 02/15/2021   Lab Results  Component Value Date   WBC 7.5 02/15/2021   HGB 13.2 02/15/2021   HCT 39.0 02/15/2021   MCV 88 02/15/2021   PLT 325 02/15/2021   Lab Results  Component Value Date   IRON 50 02/15/2021   TIBC 284 02/15/2021   FERRITIN 74 02/15/2021   Attestation Statements:   Reviewed by clinician on day of visit: allergies, medications, problem list, medical history, surgical history, family history, social history, and previous encounter notes.  Time spent on visit including pre-visit chart review and post-visit care and documentation was 42 minutes. Time was spent on: preparing to see the patient (eg, review of tests), performing a medically necessary appropriate examination and/or evaluation, counseling and educating the patient/family/caregiver, referring and communicating with other health care professionals , documenting clinical information in the electronic or other health, and Independently interpreting results and communicating results to patient.   I, Water quality scientist, CMA, am acting as transcriptionist for Briscoe Deutscher, DO  I have reviewed the above documentation for accuracy and completeness, and I agree with the above. -  Briscoe Deutscher, DO, MS, FAAFP, DABOM - Family and Bariatric Medicine.

## 2021-04-30 ENCOUNTER — Encounter (INDEPENDENT_AMBULATORY_CARE_PROVIDER_SITE_OTHER): Payer: Self-pay | Admitting: Adult Health

## 2021-04-30 ENCOUNTER — Other Ambulatory Visit: Payer: Self-pay

## 2021-04-30 ENCOUNTER — Ambulatory Visit (INDEPENDENT_AMBULATORY_CARE_PROVIDER_SITE_OTHER): Payer: No Typology Code available for payment source | Admitting: Adult Health

## 2021-04-30 VITALS — BP 150/87 | HR 71 | Temp 98.0°F | Ht 64.0 in | Wt 203.0 lb

## 2021-04-30 DIAGNOSIS — Z6837 Body mass index (BMI) 37.0-37.9, adult: Secondary | ICD-10-CM | POA: Diagnosis not present

## 2021-04-30 DIAGNOSIS — I10 Essential (primary) hypertension: Secondary | ICD-10-CM

## 2021-04-30 DIAGNOSIS — Z9189 Other specified personal risk factors, not elsewhere classified: Secondary | ICD-10-CM | POA: Diagnosis not present

## 2021-04-30 DIAGNOSIS — R7303 Prediabetes: Secondary | ICD-10-CM

## 2021-04-30 MED ORDER — TIRZEPATIDE 5 MG/0.5ML ~~LOC~~ SOAJ: 5.0000 mg | SUBCUTANEOUS | 0 refills | Status: DC

## 2021-05-01 NOTE — Progress Notes (Signed)
Chief Complaint:   OBESITY Samantha Castro is here to discuss her progress with her obesity treatment plan along with follow-up of her obesity related diagnoses. Samantha Castro is on the Category 1 Plan and states she is following her eating plan approximately 95% of the time. Samantha Castro states she is not exercising regularly.  Today's visit was #: 5 Starting weight: 220 lbs Starting date: 02/15/2021 Today's weight: 203 lbs Today's date: 04/30/2021 Total lbs lost to date: 17 lbs Total lbs lost since last in-office visit: 5 lbs  Interim History:  Samantha Castro drives an hour each way to make her office visit - [facility]  She experienced GI upset on 04/01/2021 - seen in ED - left without treatment - seen by PCP following day- treated with Zofran.  She has resumed Mounjaro therapy- denies current GI upset.  Of note:  Stress eats, will crave sugar.  Subjective:   1. Prediabetes Started on Mounjaro 2.5mg , dose increased to 5mg  on 04/10/21. Mounjaro 5 mg - injects on Sunday - 2 doses at this strength. She denies mass in neck, dysphagia, dyspepsia, persistent hoarseness, or any form of GI upset.  2. Essential hypertension BP elevated at office visit - she denies acute cardiac symptoms.  Ambulatory readings:  SBP 180, DBP 70-80s. She takes amlodipine 5 mg in the evening.  3. At risk for nausea Samantha Castro is at risk for nausea due to [facility] Dt 5 mg for prediabetes.  Assessment/Plan:   1. Prediabetes Refill Mounjaro 5 mg once weekly, as per below.  - Refill tirzepatide (MOUNJARO) 5 MG/0.5ML Pen; Inject 5 mg into the skin once a week.  Dispense: 2 mL; Refill: 0  2. Essential hypertension Monitor BP, continue with amlodipine 5 mg.  3. At risk for nausea Samantha Castro was given approximately 15 minutes of nausea prevention counseling today. Samantha Castro is at risk for nausea due to her new or current medication. She was encouraged to titrate her medication slowly, make sure to stay hydrated, eat  smaller portions throughout the day, and avoid high fat meals.   4. Obesity, current BMI 34.9  Samantha Castro is currently in the action stage of change. As such, her goal is to continue with weight loss efforts. She has agreed to the Category 1 Plan.   Exercise goals:  House renovation.  Behavioral modification strategies: increasing lean protein intake, decreasing simple carbohydrates, meal planning and cooking strategies, keeping healthy foods in the home, and planning for success.  Samantha Castro has agreed to follow-up with our clinic in 3 weeks. She was informed of the importance of frequent follow-up visits to maximize her success with intensive lifestyle modifications for her multiple health conditions.   Objective:   Blood pressure (!) 150/87, pulse 71, temperature 98 F (36.7 C), height 5\' 4"  (1.626 m), weight 203 lb (92.1 kg), SpO2 98 %. Body mass index is 34.84 kg/m.  General: Cooperative, alert, well developed, in no acute distress. HEENT: Conjunctivae and lids unremarkable. Cardiovascular: Regular rhythm.  Lungs: Normal work of breathing. Neurologic: No focal deficits.   Lab Results  Component Value Date   CREATININE 0.92 02/15/2021   BUN 20 02/15/2021   NA 139 02/15/2021   K 4.3 02/15/2021   CL 100 02/15/2021   CO2 24 02/15/2021   Lab Results  Component Value Date   ALT 23 02/15/2021   AST 21 02/15/2021   ALKPHOS 112 02/15/2021   BILITOT 0.3 02/15/2021   Lab Results  Component Value Date   HGBA1C 6.2 (H)  02/15/2021   Lab Results  Component Value Date   INSULIN 10.8 02/15/2021   Lab Results  Component Value Date   TSH 1.360 02/15/2021   Lab Results  Component Value Date   CHOL 212 (H) 02/15/2021   HDL 58 02/15/2021   LDLCALC 139 (H) 02/15/2021   TRIG 85 02/15/2021   CHOLHDL 3.7 02/15/2021   Lab Results  Component Value Date   VD25OH 42.7 02/15/2021   Lab Results  Component Value Date   WBC 7.5 02/15/2021   HGB 13.2 02/15/2021   HCT 39.0 02/15/2021    MCV 88 02/15/2021   PLT 325 02/15/2021   Lab Results  Component Value Date   IRON 50 02/15/2021   TIBC 284 02/15/2021   FERRITIN 74 02/15/2021   Attestation Statements:   Reviewed by clinician on day of visit: allergies, medications, problem list, medical history, surgical history, family history, social history, and previous encounter notes.  I, Water quality scientist, CMA, am acting as Location manager for Mina Marble, NP.  I have reviewed the above documentation for accuracy and completeness, and I agree with the above. - Liesa Tsan d. Jonovan Boedecker, NP-C

## 2021-05-07 ENCOUNTER — Encounter (INDEPENDENT_AMBULATORY_CARE_PROVIDER_SITE_OTHER): Payer: Self-pay | Admitting: Family Medicine

## 2021-05-08 ENCOUNTER — Other Ambulatory Visit (INDEPENDENT_AMBULATORY_CARE_PROVIDER_SITE_OTHER): Payer: Self-pay

## 2021-05-08 DIAGNOSIS — R7303 Prediabetes: Secondary | ICD-10-CM

## 2021-05-08 MED ORDER — TIRZEPATIDE 2.5 MG/0.5ML ~~LOC~~ SOAJ
2.5000 mg | SUBCUTANEOUS | 0 refills | Status: DC
Start: 2021-05-08 — End: 2021-05-30

## 2021-05-08 NOTE — Telephone Encounter (Signed)
Pt last seen by Katy Danford, FNP.  

## 2021-05-08 NOTE — Telephone Encounter (Signed)
Pt requesting prescription for Mounjaro 2.5mg  be sent to [place] because 5mg  is on backorder.She does not think her insurance will cover 2 shots a week to make up the 5mg  but she does not want to stop the medication.

## 2021-05-21 ENCOUNTER — Ambulatory Visit (INDEPENDENT_AMBULATORY_CARE_PROVIDER_SITE_OTHER): Payer: No Typology Code available for payment source | Admitting: Family Medicine

## 2021-05-30 ENCOUNTER — Encounter (INDEPENDENT_AMBULATORY_CARE_PROVIDER_SITE_OTHER): Payer: Self-pay | Admitting: Family Medicine

## 2021-05-30 ENCOUNTER — Other Ambulatory Visit: Payer: Self-pay

## 2021-05-30 ENCOUNTER — Ambulatory Visit (INDEPENDENT_AMBULATORY_CARE_PROVIDER_SITE_OTHER): Payer: PRIVATE HEALTH INSURANCE | Admitting: Family Medicine

## 2021-05-30 VITALS — BP 150/88 | HR 73 | Temp 97.9°F | Ht 64.0 in | Wt 197.0 lb

## 2021-05-30 DIAGNOSIS — R7303 Prediabetes: Secondary | ICD-10-CM | POA: Diagnosis not present

## 2021-05-30 DIAGNOSIS — I10 Essential (primary) hypertension: Secondary | ICD-10-CM

## 2021-05-30 DIAGNOSIS — K5909 Other constipation: Secondary | ICD-10-CM | POA: Diagnosis not present

## 2021-05-30 DIAGNOSIS — Z6837 Body mass index (BMI) 37.0-37.9, adult: Secondary | ICD-10-CM

## 2021-05-30 MED ORDER — LINACLOTIDE 290 MCG PO CAPS: 290.0000 ug | ORAL_CAPSULE | Freq: Every day | ORAL | 0 refills | Status: AC

## 2021-05-30 MED ORDER — TIRZEPATIDE 2.5 MG/0.5ML ~~LOC~~ SOAJ: 2.5000 mg | SUBCUTANEOUS | 0 refills | Status: DC

## 2021-05-30 MED ORDER — TIRZEPATIDE 5 MG/0.5ML ~~LOC~~ SOAJ: 5.0000 mg | SUBCUTANEOUS | 1 refills | Status: DC

## 2021-05-30 NOTE — Telephone Encounter (Signed)
Did not get message before pts appt, will address during visit

## 2021-05-30 NOTE — Progress Notes (Signed)
Chief Complaint:   OBESITY Samantha Castro is here to discuss her progress with her obesity treatment plan along with follow-up of her obesity related diagnoses. See Medical Weight Management Flowsheet for complete bioelectrical impedance results.  Today's visit was #: 6 Starting weight: 220 lbs Starting date: 02/15/2021 Weight change since last visit: 6 lbs Total lbs lost to date: 23 lbs Total weight loss percentage to date: -10.45%  Nutrition Plan: Category 1 Plan for 95% of the time.  Activity: Increased activity. Anti-obesity medications: Mounjaro 2.5 mg subcutaneously weekly. Reported side effects: None.  Interim History: Samantha Castro is taking Mounjaro 2.5 mg.  She was previously on 5 mg, but that dose is on backorder.  Insurance will no longer cover Linzess, so she is having to try Lactulose.  She is down 6 pounds during the holidays.   Assessment/Plan:   1. Chronic constipation Samantha Castro was previously taking Linzess, but says her insurance will no longer cover it, so she has been trying Lactulose.  Plan:  Linzess 290 mcg daily. We will see if they will fill a different dose of Linzess prior to the end of the year.  - Start linaclotide (LINZESS) 290 MCG CAPS capsule; Take 1 capsule (290 mcg total) by mouth daily before breakfast.  Dispense: 90 capsule; Refill: 0  2. Prediabetes Not at goal. Goal is HgbA1c < 5.7.  Medication: Mounjaro 2.5 mg subcutaneously weekly.    Plan:  Continue Mounjaro 2.5 mg subcutaneously weekly.  Will also send in 5 mg dose.  She will continue to focus on protein-rich, low simple carbohydrate foods. We reviewed the importance of hydration, regular exercise for stress reduction, and restorative sleep.   Lab Results  Component Value Date   HGBA1C 6.2 (H) 02/15/2021   Lab Results  Component Value Date   INSULIN 10.8 02/15/2021   - Refil tirzepatide (MOUNJARO) 2.5 MG/0.5ML Pen; Inject 2.5 mg into the skin once a week.  Dispense: 2 mL; Refill: 0 - Refill  tirzepatide (MOUNJARO) 5 MG/0.5ML Pen; Inject 5 mg into the skin once a week.  Dispense: 2 mL; Refill: 1  3. Essential hypertension Elevated today. Medications: Norvasc 5 mg daily.   Plan: Avoid buying foods that are: processed, frozen, or prepackaged to avoid excess salt. We will watch for signs of hypotension as she continues lifestyle modifications.  BP Readings from Last 3 Encounters:  05/30/21 (!) 150/88  04/30/21 (!) 150/87  04/10/21 139/82   Lab Results  Component Value Date   CREATININE 0.92 02/15/2021   4. Obesity, current BMI 33.9  Course: Samantha Castro is currently in the action stage of change. As such, her goal is to continue with weight loss efforts.   Nutrition goals: She has agreed to the Category 1 Plan.   Exercise goals:  As is.  Behavioral modification strategies: increasing lean protein intake, decreasing simple carbohydrates, increasing vegetables, increasing water intake, and decreasing liquid calories.  Samantha Castro has agreed to follow-up with our clinic in 3 weeks. She was informed of the importance of frequent follow-up visits to maximize her success with intensive lifestyle modifications for her multiple health conditions.   Objective:   Blood pressure (!) 150/88, pulse 73, temperature 97.9 F (36.6 C), temperature source Oral, height 5\' 4"  (1.626 m), weight 197 lb (89.4 kg), SpO2 96 %. Body mass index is 33.81 kg/m.  General: Cooperative, alert, well developed, in no acute distress. HEENT: Conjunctivae and lids unremarkable. Cardiovascular: Regular rhythm.  Lungs: Normal work of breathing. Neurologic: No focal deficits.  Lab Results  Component Value Date   CREATININE 0.92 02/15/2021   BUN 20 02/15/2021   NA 139 02/15/2021   K 4.3 02/15/2021   CL 100 02/15/2021   CO2 24 02/15/2021   Lab Results  Component Value Date   ALT 23 02/15/2021   AST 21 02/15/2021   ALKPHOS 112 02/15/2021   BILITOT 0.3 02/15/2021   Lab Results  Component Value Date    HGBA1C 6.2 (H) 02/15/2021   Lab Results  Component Value Date   INSULIN 10.8 02/15/2021   Lab Results  Component Value Date   TSH 1.360 02/15/2021   Lab Results  Component Value Date   CHOL 212 (H) 02/15/2021   HDL 58 02/15/2021   LDLCALC 139 (H) 02/15/2021   TRIG 85 02/15/2021   CHOLHDL 3.7 02/15/2021   Lab Results  Component Value Date   VD25OH 42.7 02/15/2021   Lab Results  Component Value Date   WBC 7.5 02/15/2021   HGB 13.2 02/15/2021   HCT 39.0 02/15/2021   MCV 88 02/15/2021   PLT 325 02/15/2021   Lab Results  Component Value Date   IRON 50 02/15/2021   TIBC 284 02/15/2021   FERRITIN 74 02/15/2021   Attestation Statements:   Reviewed by clinician on day of visit: allergies, medications, problem list, medical history, surgical history, family history, social history, and previous encounter notes.  I, Water quality scientist, CMA, am acting as transcriptionist for Briscoe Deutscher, DO  I have reviewed the above documentation for accuracy and completeness, and I agree with the above. -  Briscoe Deutscher, DO, MS, FAAFP, DABOM - Family and Bariatric Medicine.

## 2021-06-10 ENCOUNTER — Encounter (INDEPENDENT_AMBULATORY_CARE_PROVIDER_SITE_OTHER): Payer: Self-pay | Admitting: Family Medicine

## 2021-06-10 DIAGNOSIS — E66812 Obesity, class 2: Secondary | ICD-10-CM

## 2021-06-10 DIAGNOSIS — Z6837 Body mass index (BMI) 37.0-37.9, adult: Secondary | ICD-10-CM

## 2021-06-11 ENCOUNTER — Ambulatory Visit (INDEPENDENT_AMBULATORY_CARE_PROVIDER_SITE_OTHER): Payer: No Typology Code available for payment source | Admitting: Adult Health

## 2021-06-12 NOTE — Telephone Encounter (Signed)
Pt last seen by Dr. Wallace.  

## 2021-06-17 MED ORDER — WEGOVY 0.25 MG/0.5ML ~~LOC~~ SOAJ: 0.2500 mg | SUBCUTANEOUS | 0 refills | Status: DC

## 2021-06-17 MED ORDER — WEGOVY 0.5 MG/0.5ML ~~LOC~~ SOAJ: 0.5000 mg | SUBCUTANEOUS | 0 refills | Status: DC

## 2021-06-17 NOTE — Addendum Note (Signed)
Addended by: Karren Cobble on: 06/17/2021 08:16 AM   Modules accepted: Orders

## 2021-06-19 ENCOUNTER — Ambulatory Visit (INDEPENDENT_AMBULATORY_CARE_PROVIDER_SITE_OTHER): Payer: PRIVATE HEALTH INSURANCE | Admitting: Family Medicine

## 2021-06-19 ENCOUNTER — Other Ambulatory Visit: Payer: Self-pay

## 2021-06-19 ENCOUNTER — Encounter (INDEPENDENT_AMBULATORY_CARE_PROVIDER_SITE_OTHER): Payer: Self-pay | Admitting: Family Medicine

## 2021-06-19 ENCOUNTER — Ambulatory Visit (INDEPENDENT_AMBULATORY_CARE_PROVIDER_SITE_OTHER): Payer: No Typology Code available for payment source | Admitting: Adult Health

## 2021-06-19 VITALS — BP 138/88 | HR 74 | Temp 97.9°F | Ht 64.0 in | Wt 193.0 lb

## 2021-06-19 DIAGNOSIS — Z6833 Body mass index (BMI) 33.0-33.9, adult: Secondary | ICD-10-CM | POA: Diagnosis not present

## 2021-06-19 DIAGNOSIS — I10 Essential (primary) hypertension: Secondary | ICD-10-CM | POA: Diagnosis not present

## 2021-06-19 DIAGNOSIS — R7303 Prediabetes: Secondary | ICD-10-CM | POA: Diagnosis not present

## 2021-06-19 DIAGNOSIS — Z6837 Body mass index (BMI) 37.0-37.9, adult: Secondary | ICD-10-CM

## 2021-06-19 NOTE — Progress Notes (Signed)
Chief Complaint:   OBESITY Samantha Castro is here to discuss her progress with her obesity treatment plan along with follow-up of her obesity related diagnoses. [name] is on the Category 1 Plan and states she is following her eating plan approximately 95% of the time. [name] states she is doing gym exercise for 90 minutes 3 times per week.  Today's visit was #: 7 Starting weight: 220 lbs Starting date: 02/15/2021 Today's weight: 193 lbs Today's date: 06/19/2021 Total lbs lost to date: 27 lbs Total lbs lost since last in-office visit: 4 lbs  Interim History: [name] notes she has totally changed her diet since starting our program. She used to drink several soda per day but has cut that out. She has done a great job and has lost 27 lbs since September 2022.  She is getting in the prescribed protein. She says she does not feel "deprived" when she cannot eat what her family eats. She adheres to plan extremely well.   Subjective:   1. Prediabetes Samantha Castro has been finishing up the [facility] and has one more injection. She has not started the [facility] yet. Her appetite is controlled.   Lab Results  Component Value Date   HGBA1C 6.2 (H) 02/15/2021   Lab Results  Component Value Date   INSULIN 10.8 02/15/2021    2. Essential hypertension Samantha Castro's blood pressure was elevated at 1st check today. 2nd check was 138/88. She feels this may be because she has been taking some decongestants recently and had a stressful drive in to the clinic.   BP Readings from Last 3 Encounters:  06/19/21 138/88  05/30/21 (!) 150/88  04/30/21 (!) 150/87    Assessment/Plan:   1. Prediabetes [name] will start the [facility] after she finishes the [facility].  2. Essential hypertension Samantha Castro will continue Norvasc. She is working on healthy weight loss and exercise to improve blood pressure control. We will watch for signs of hypotension as she continues her lifestyle modifications.  3. Obesity: Current BMI  33.11 Samantha Castro is currently in the action stage of change. As such, her goal is to continue with weight loss efforts. She has agreed to keeping a food journal and adhering to recommended goals of 300-450 calories and 35 grams of protein at supper.  Handouts: Recipes.  Exercise goals:  As is.  Behavioral modification strategies: meal planning and cooking strategies.  Samantha Castro has agreed to follow-up with our clinic in 3 weeks.  Objective:   Blood pressure 138/88, pulse 74, temperature 97.9 F (36.6 C), height 5\' 4"  (1.626 m), weight 193 lb (87.5 kg), SpO2 98 %. Body mass index is 33.13 kg/m.  General: Cooperative, alert, well developed, in no acute distress. HEENT: Conjunctivae and lids unremarkable. Cardiovascular: Regular rhythm.  Lungs: Normal work of breathing. Neurologic: No focal deficits.   Lab Results  Component Value Date   CREATININE 0.92 02/15/2021   BUN 20 02/15/2021   NA 139 02/15/2021   K 4.3 02/15/2021   CL 100 02/15/2021   CO2 24 02/15/2021   Lab Results  Component Value Date   ALT 23 02/15/2021   AST 21 02/15/2021   ALKPHOS 112 02/15/2021   BILITOT 0.3 02/15/2021   Lab Results  Component Value Date   HGBA1C 6.2 (H) 02/15/2021   Lab Results  Component Value Date   INSULIN 10.8 02/15/2021   Lab Results  Component Value Date   TSH 1.360 02/15/2021   Lab Results  Component Value Date   CHOL 212 (H) 02/15/2021  HDL 58 02/15/2021   LDLCALC 139 (H) 02/15/2021   TRIG 85 02/15/2021   CHOLHDL 3.7 02/15/2021   Lab Results  Component Value Date   VD25OH 42.7 02/15/2021   Lab Results  Component Value Date   WBC 7.5 02/15/2021   HGB 13.2 02/15/2021   HCT 39.0 02/15/2021   MCV 88 02/15/2021   PLT 325 02/15/2021   Lab Results  Component Value Date   IRON 50 02/15/2021   TIBC 284 02/15/2021   FERRITIN 74 02/15/2021   Attestation Statements:   Reviewed by clinician on day of visit: allergies, medications, problem list, medical history,  surgical history, family history, social history, and previous encounter notes.  I, Lizbeth Bark, RMA, am acting as Location manager for [employer], [place].  I have reviewed the above documentation for accuracy and completeness, and I agree with the above. -  Georgianne Fick, FNP

## 2021-07-08 ENCOUNTER — Encounter (INDEPENDENT_AMBULATORY_CARE_PROVIDER_SITE_OTHER): Payer: Self-pay | Admitting: Family Medicine

## 2021-07-08 ENCOUNTER — Other Ambulatory Visit: Payer: Self-pay

## 2021-07-08 ENCOUNTER — Ambulatory Visit (INDEPENDENT_AMBULATORY_CARE_PROVIDER_SITE_OTHER): Payer: PRIVATE HEALTH INSURANCE | Admitting: Family Medicine

## 2021-07-08 VITALS — BP 149/86 | HR 65 | Temp 97.8°F | Ht 64.0 in | Wt 193.0 lb

## 2021-07-08 DIAGNOSIS — I10 Essential (primary) hypertension: Secondary | ICD-10-CM

## 2021-07-08 DIAGNOSIS — Z6837 Body mass index (BMI) 37.0-37.9, adult: Secondary | ICD-10-CM

## 2021-07-08 DIAGNOSIS — K5909 Other constipation: Secondary | ICD-10-CM | POA: Diagnosis not present

## 2021-07-08 DIAGNOSIS — Z6833 Body mass index (BMI) 33.0-33.9, adult: Secondary | ICD-10-CM

## 2021-07-08 DIAGNOSIS — R7303 Prediabetes: Secondary | ICD-10-CM

## 2021-07-08 DIAGNOSIS — E669 Obesity, unspecified: Secondary | ICD-10-CM

## 2021-07-08 DIAGNOSIS — R632 Polyphagia: Secondary | ICD-10-CM | POA: Diagnosis not present

## 2021-07-09 MED ORDER — WEGOVY 1.7 MG/0.75ML ~~LOC~~ SOAJ: 1.7000 mg | SUBCUTANEOUS | 0 refills | Status: DC

## 2021-07-09 MED ORDER — WEGOVY 2.4 MG/0.75ML ~~LOC~~ SOAJ: 2.4000 mg | SUBCUTANEOUS | 2 refills | Status: DC

## 2021-07-09 MED ORDER — WEGOVY 1 MG/0.5ML ~~LOC~~ SOAJ: 1.0000 mg | SUBCUTANEOUS | 0 refills | Status: DC

## 2021-07-09 NOTE — Progress Notes (Signed)
Chief Complaint:   OBESITY Samantha Castro is here to discuss her progress with her obesity treatment plan along with follow-up of her obesity related diagnoses. See Medical Weight Management Flowsheet for complete bioelectrical impedance results.  Today's visit was #: 8 Starting weight: 220 lbs Starting date: 02/15/2021 Weight change since last visit: 0 Total lbs lost to date: 27 lbs Total weight loss percentage to date: -12.27%  Nutrition Plan: Category 1 Plan for 95% of the time.  Activity: Cardio/strength training/swimming for 60-90 minutes 2-3 times per week. Anti-obesity medications: Wegovy 0.25 mg subcutaneously weekly. Reported side effects: None.  Interim History: [name] endorses constipation.  She is taking Linzess and Lactulose.  She will consider orlistat and increase her hydration.  She says she has been active, working on the house.  She started [facility] yesterday - okay to ramp up prescriptions.  She says she stopped logging her food in the past.  Her sleep has not changed, she says, due to menopause.  [name] provided the following food recall today: Breakfast:  Yogurt and peanut butter. Snack:  Cheese stick. Lunch:  Sandwich for lunch and vegetable/apple. Dinner:  Counsellor.  Assessment/Plan:   1. Polyphagia Not at goal. Current treatment: Wegovy 0.25 mg subcutaneously weekly.    Plan: Continue Wegovy 0.25 mg subcutaneously weekly.  She already has a prescription for the 0.5 mg dose at her pharmacy. She will continue to focus on protein-rich, low simple carbohydrate foods. We reviewed the importance of hydration, regular exercise for stress reduction, and restorative sleep.  - Semaglutide-Weight Management (WEGOVY) 1 MG/0.5ML SOAJ; Inject 1 mg into the skin once a week.  Dispense: 2 mL; Refill: 0 - Semaglutide-Weight Management (WEGOVY) 1.7 MG/0.75ML SOAJ; Inject 1.7 mg into the skin once a week.  Dispense: 3 mL; Refill: 0 - Semaglutide-Weight Management (WEGOVY)  2.4 MG/0.75ML SOAJ; Inject 2.4 mg into the skin once a week.  Dispense: 3 mL; Refill: 2  2. Chronic constipation She is taking Linzess and Lactulose.  She will increase her water intake and consider orlistat. Risk versus benefits of medication reviewed. The patient understands monitoring parameters and red flags.   3. Essential hypertension Elevated today. Medications: Norvasc 5 mg daily.   Plan: Avoid buying foods that are: processed, frozen, or prepackaged to avoid excess salt. We will watch for signs of hypotension as she continues lifestyle modifications.  BP Readings from Last 3 Encounters:  07/08/21 (!) 149/86  06/19/21 138/88  05/30/21 (!) 150/88   Lab Results  Component Value Date   CREATININE 0.92 02/15/2021   4. Prediabetes Not at goal. Goal is HgbA1c < 5.7.  Medication: None.    Plan: She will continue to focus on protein-rich, low simple carbohydrate foods. We reviewed the importance of hydration, regular exercise for stress reduction, and restorative sleep.   Lab Results  Component Value Date   HGBA1C 6.2 (H) 02/15/2021   Lab Results  Component Value Date   INSULIN 10.8 02/15/2021   5. Obesity, current BMI 33.1  Course: Samantha Castro is currently in the action stage of change. As such, her goal is to continue with weight loss efforts.   Nutrition goals: She has agreed to the Category 1 Plan.   Exercise goals:  As is.  Behavioral modification strategies: increasing lean protein intake, decreasing simple carbohydrates, increasing vegetables, increasing water intake, decreasing liquid calories, decreasing alcohol intake, and keeping a strict food journal.  [name] has agreed to follow-up with our clinic in 4 weeks. She was informed  of the importance of frequent follow-up visits to maximize her success with intensive lifestyle modifications for her multiple health conditions.   Objective:   Blood pressure (!) 149/86, pulse 65, temperature 97.8 F (36.6 C), temperature  source Oral, height 5\' 4"  (1.626 m), weight 193 lb (87.5 kg), SpO2 98 %. Body mass index is 33.13 kg/m.  General: Cooperative, alert, well developed, in no acute distress. HEENT: Conjunctivae and lids unremarkable. Cardiovascular: Regular rhythm.  Lungs: Normal work of breathing. Neurologic: No focal deficits.   Lab Results  Component Value Date   CREATININE 0.92 02/15/2021   BUN 20 02/15/2021   NA 139 02/15/2021   K 4.3 02/15/2021   CL 100 02/15/2021   CO2 24 02/15/2021   Lab Results  Component Value Date   ALT 23 02/15/2021   AST 21 02/15/2021   ALKPHOS 112 02/15/2021   BILITOT 0.3 02/15/2021   Lab Results  Component Value Date   HGBA1C 6.2 (H) 02/15/2021   Lab Results  Component Value Date   INSULIN 10.8 02/15/2021   Lab Results  Component Value Date   TSH 1.360 02/15/2021   Lab Results  Component Value Date   CHOL 212 (H) 02/15/2021   HDL 58 02/15/2021   LDLCALC 139 (H) 02/15/2021   TRIG 85 02/15/2021   CHOLHDL 3.7 02/15/2021   Lab Results  Component Value Date   VD25OH 42.7 02/15/2021   Lab Results  Component Value Date   WBC 7.5 02/15/2021   HGB 13.2 02/15/2021   HCT 39.0 02/15/2021   MCV 88 02/15/2021   PLT 325 02/15/2021   Lab Results  Component Value Date   IRON 50 02/15/2021   TIBC 284 02/15/2021   FERRITIN 74 02/15/2021   Attestation Statements:   Reviewed by clinician on day of visit: allergies, medications, problem list, medical history, surgical history, family history, social history, and previous encounter notes.  Time spent on visit including pre-visit chart review and post-visit care and documentation was 42 minutes. Time was spent on: Food choices and timing of food intake reviewed today. I discussed a personalized meal plan with the patient that will help her to lose weight and will improve her obesity-related conditions going forward. I performed a medically necessary appropriate examination and/or evaluation. I discussed the  assessment and treatment plan with the patient. Motivational interviewing as well as evidence-based interventions for health behavior change were utilized today including the discussion of self monitoring techniques, problem-solving barriers and SMART goal setting techniques.  The patient was provided an opportunity to ask questions and all were answered. The patient agreed with the plan and demonstrated an understanding of the instructions. Clinical information was updated and documented in the EMR.  I, Water quality scientist, CMA, am acting as transcriptionist for Briscoe Deutscher, DO  I have reviewed the above documentation for accuracy and completeness, and I agree with the above. -  Briscoe Deutscher, DO, MS, FAAFP, DABOM - Family and Bariatric Medicine.

## 2021-08-01 ENCOUNTER — Encounter (INDEPENDENT_AMBULATORY_CARE_PROVIDER_SITE_OTHER): Payer: Self-pay | Admitting: Family Medicine

## 2021-08-01 ENCOUNTER — Ambulatory Visit (INDEPENDENT_AMBULATORY_CARE_PROVIDER_SITE_OTHER): Payer: PRIVATE HEALTH INSURANCE | Admitting: Family Medicine

## 2021-08-01 ENCOUNTER — Other Ambulatory Visit: Payer: Self-pay

## 2021-08-01 VITALS — BP 150/92 | HR 72 | Temp 98.6°F | Ht 64.0 in | Wt 189.0 lb

## 2021-08-01 DIAGNOSIS — I10 Essential (primary) hypertension: Secondary | ICD-10-CM | POA: Diagnosis not present

## 2021-08-01 DIAGNOSIS — Z6832 Body mass index (BMI) 32.0-32.9, adult: Secondary | ICD-10-CM

## 2021-08-01 DIAGNOSIS — Z9189 Other specified personal risk factors, not elsewhere classified: Secondary | ICD-10-CM

## 2021-08-01 DIAGNOSIS — E669 Obesity, unspecified: Secondary | ICD-10-CM

## 2021-08-01 DIAGNOSIS — R7303 Prediabetes: Secondary | ICD-10-CM | POA: Diagnosis not present

## 2021-08-01 DIAGNOSIS — Z6837 Body mass index (BMI) 37.0-37.9, adult: Secondary | ICD-10-CM

## 2021-08-01 DIAGNOSIS — E559 Vitamin D deficiency, unspecified: Secondary | ICD-10-CM

## 2021-08-07 NOTE — Progress Notes (Signed)
? ? ? ?Chief Complaint:  ? ?OBESITY ?Samantha Castro is here to discuss her progress with her obesity treatment plan along with follow-up of her obesity related diagnoses. Samantha Castro is on the Category 1 Plan and states she is following her eating plan approximately 95% of the time. Samantha Castro states she is not currently exercising. ? ?Today's visit was #: 9 ?Starting weight: 220 lbs ?Starting date: 02/15/2021 ?Today's weight: 189 lbs ?Today's date: 08/01/2021 ?Total lbs lost to date: 82 ?Total lbs lost since last in-office visit: 4 ? ?Interim History: Samantha Castro is a patient of Dr. Alcario Drought, and this is her first office visit with me. Samantha Castro is here for a follow up office visit.  We reviewed her meal plan and questions were answered. Patient's food recall appears to be accurate and consistent with what is on plan when she is following it. When eating on plan, her hunger and cravings are well controlled. She is doing well with weight loss.  ? ?Subjective:  ? ?1. Essential hypertension ?Samantha Castro has an appointment with her PCP next week for medication adjustment. She is on Norvasc only. Her blood pressure is 144/98 at home. She is asymptomatic and has no concerns. She notes lisinopril cause cough prior. No ARB tried in the past.  ? ?2. Pre-diabetes ?Dr. Juleen China started Samantha Castro on [place] on 02/28/2021. She has had nausea without vomiting, body aches, and poor appetite with this medications in the past. (Prior office visit notes were reviewed). Her last A1c 5 months ago was at 6.2. she was given multiple Wegovy prescriptions of increasing dose for 6 months. ? ?3. Vitamin D deficiency ?Samantha Castro's last Vit D check was >5 months ago. Last Vit D level was 42 at that time. She is on OTC supplementation, but she is not sure of the exact amount. ? ?4. At risk for side effect of medication ?Samantha Castro is at risk for drug side effects due to increasing dose of Wegovy. ? ?Assessment/Plan:  ?No orders of the defined types were placed in this  encounter. ? ? ?There are no discontinued medications.  ? ?No orders of the defined types were placed in this encounter. ?  ? ?1. Essential hypertension ? Samantha Castro's blood pressure is not at goal today, and hasn't been for the past several times. (Goal is <130/80 on a regular basis). She will continue her medication management per her PCP. I stressed the importance of decreasing salt, increasing water intake, and avoid prepared meals. She is to increase her activity. ? ?2. Pre-diabetes ?There is no need for a refill of Wegovy today. Samantha Castro will adjust her dose per Dr. Alcario Drought previous treatment plan. I advised her to follow her prudent nutritional plan, increase protein, and decrease simple carbohydrates. ? ?3. Vitamin D deficiency ?Samantha Castro's Vit D goal is 50-70, but is not at goal currently. I recommended that she discussed possible dose adjustment at her next office visit with Dr. Juleen China. ? ?4. At risk for side effect of medication ?Samantha Castro was given approximately 9 minutes of drug side effect counseling today.  We discussed side effect possibility and risk versus benefits. Samantha Castro agreed to the medication and will contact this office if these side effects are intolerable. ? ?Repetitive spaced learning was employed today to elicit superior memory formation and behavioral change. ? ?5. Obesity, current BMI 32.6 ?Samantha Castro is currently in the action stage of change. As such, her goal is to continue with weight loss efforts. She has agreed to the Category 1 Plan.  ? ?Home blood pressure  monitoring every day and bring in log to her next follow office visit with her PCP in the near future. ? ?Exercise goals: All adults should avoid inactivity. Some physical activity is better than none, and adults who participate in any amount of physical activity gain some health benefits. ? ?Behavioral modification strategies: increasing lean protein intake, decreasing simple carbohydrates, and meal planning and cooking  strategies. ? ?Samantha Castro has agreed to follow-up with our clinic in 3 weeks. She was informed of the importance of frequent follow-up visits to maximize her success with intensive lifestyle modifications for her multiple health conditions.  ? ?Objective:  ? ?Blood pressure (!) 150/92, pulse 72, temperature 98.6 ?F (37 ?C), height '5\' 4"'$  (1.626 m), weight 189 lb (85.7 kg), SpO2 96 %. ?Body mass index is 32.44 kg/m?. ? ?General: Cooperative, alert, well developed, in no acute distress. ?HEENT: Conjunctivae and lids unremarkable. ?Cardiovascular: Regular rhythm.  ?Lungs: Normal work of breathing. ?Neurologic: No focal deficits.  ? ?Lab Results  ?Component Value Date  ? CREATININE 0.92 02/15/2021  ? BUN 20 02/15/2021  ? NA 139 02/15/2021  ? K 4.3 02/15/2021  ? CL 100 02/15/2021  ? CO2 24 02/15/2021  ? ?Lab Results  ?Component Value Date  ? ALT 23 02/15/2021  ? AST 21 02/15/2021  ? ALKPHOS 112 02/15/2021  ? BILITOT 0.3 02/15/2021  ? ?Lab Results  ?Component Value Date  ? HGBA1C 6.2 (H) 02/15/2021  ? ?Lab Results  ?Component Value Date  ? INSULIN 10.8 02/15/2021  ? ?Lab Results  ?Component Value Date  ? TSH 1.360 02/15/2021  ? ?Lab Results  ?Component Value Date  ? CHOL 212 (H) 02/15/2021  ? HDL 58 02/15/2021  ? LDLCALC 139 (H) 02/15/2021  ? TRIG 85 02/15/2021  ? CHOLHDL 3.7 02/15/2021  ? ?Lab Results  ?Component Value Date  ? VD25OH 42.7 02/15/2021  ? ?Lab Results  ?Component Value Date  ? WBC 7.5 02/15/2021  ? HGB 13.2 02/15/2021  ? HCT 39.0 02/15/2021  ? MCV 88 02/15/2021  ? PLT 325 02/15/2021  ? ?Lab Results  ?Component Value Date  ? IRON 50 02/15/2021  ? TIBC 284 02/15/2021  ? FERRITIN 74 02/15/2021  ? ?Attestation Statements:  ? ?Reviewed by clinician on day of visit: allergies, medications, problem list, medical history, surgical history, family history, social history, and previous encounter notes. ? ? ?I, Samantha Castro, am acting as transcriptionist for [employer], DO. ? ?I have reviewed the above documentation  for accuracy and completeness, and I agree with the above. Samantha Castro, D.O. ? ?The [place] was signed into law in 2016 which includes the topic of electronic health records.  This provides immediate access to information in MyChart.  This includes consultation notes, operative notes, office notes, lab results and pathology reports.  If you have any questions about what you read please let us know at your next visit so we can discuss your concerns and take corrective action if need be.  We are right here with you. ? ? ?

## 2021-08-21 ENCOUNTER — Encounter (INDEPENDENT_AMBULATORY_CARE_PROVIDER_SITE_OTHER): Payer: Self-pay | Admitting: Family Medicine

## 2021-08-21 ENCOUNTER — Ambulatory Visit (INDEPENDENT_AMBULATORY_CARE_PROVIDER_SITE_OTHER): Payer: PRIVATE HEALTH INSURANCE | Admitting: Family Medicine

## 2021-08-21 ENCOUNTER — Other Ambulatory Visit: Payer: Self-pay

## 2021-08-21 VITALS — BP 146/84 | HR 67 | Temp 97.8°F | Ht 64.0 in | Wt 190.0 lb

## 2021-08-21 DIAGNOSIS — R7303 Prediabetes: Secondary | ICD-10-CM

## 2021-08-21 DIAGNOSIS — R632 Polyphagia: Secondary | ICD-10-CM

## 2021-08-21 DIAGNOSIS — K5909 Other constipation: Secondary | ICD-10-CM

## 2021-08-21 DIAGNOSIS — Z6832 Body mass index (BMI) 32.0-32.9, adult: Secondary | ICD-10-CM

## 2021-08-21 DIAGNOSIS — E669 Obesity, unspecified: Secondary | ICD-10-CM

## 2021-08-21 DIAGNOSIS — E66812 Obesity, class 2: Secondary | ICD-10-CM

## 2021-08-30 NOTE — Progress Notes (Signed)
Chief Complaint:   OBESITY Samantha Castro is here to discuss her progress with her obesity treatment plan along with follow-up of her obesity related diagnoses. See Medical Weight Management Flowsheet for complete bioelectrical impedance results.  Today's visit was #: 10 Starting weight: 220 lbs Starting date: 02/15/2021 Today's weight: 190 lbs Weight change since last visit: +1 lbs Total lbs lost to date: 30 lbs Total weight loss percentage to date: 13.64%  Nutrition Plan: Category 1; Samantha Castro reports that she has been following her meal plan approximately 95% of the time since her last visit. Activity: Samantha Castro has been walking 20-30 minutes about 5 times per week since the last time we have seen her. Anti-obesity medications: Wegovy 0.'5mg'$ /0.71m once weekly.  Reported side effects: none reported at this time.  Interim History: Samantha Castro up one pound since her last visit. She does report some increase polyphagia since then as well. She is actively working as well as going to school, which could be contributing to this. She is still taking Wegovy 0.'5mg'$  injection once weekly to help combat this polyphagia.  Assessment/Plan:   1. Polyphagia Not at goal.  She will continue to focus on protein-rich, low simple carbohydrate foods. We reviewed the importance of hydration, regular exercise for stress reduction, and restorative sleep. For now, I recommend that [facility]  each week. I will continue to follow her symptoms closely.   2. Chronic constipation Irregular bowel habits such as constipation can lead to many problems over time.  Having one soft bowel movement a day is the most important way to prevent further problems.  The patient understands monitoring parameters and red flags. The current medical regimen is effective;  continue present plan and medications.  3. Prediabetes Not at goal. Goal is HgbA1c < 5.7.  Medication: None.    Plan:  She will continue to focus  on protein-rich, low simple carbohydrate foods. We reviewed the importance of hydration, regular exercise for stress reduction, and restorative sleep.   Lab Results  Component Value Date   HGBA1C 6.2 (H) 02/15/2021   Lab Results  Component Value Date   INSULIN 10.8 02/15/2021   4. Obesity, current BMI 32.7  Course: Samantha Castro currently in the action stage of change. As such, her goal is to continue with weight loss efforts.   Nutrition goals: She has agreed to continue the Category 1 Plan.   Exercise goals: All adults should avoid inactivity. Some physical activity is better than none, and adults who participate in any amount of physical activity gain some health benefits.  Behavioral modification strategies: increasing lean protein intake, decreasing simple carbohydrates, and meal planning and cooking strategies.  Samantha Castro agreed to follow-up with our clinic in 6 weeks. She was informed of the importance of frequent follow-up visits to maximize her success with intensive lifestyle modifications for her multiple health conditions.    Blood pressure (!) 146/84, pulse 67, temperature 97.8 F (36.6 C), temperature source Oral, height '5\' 4"'$  (1.626 m), weight 190 lb (86.2 kg), SpO2 97 %. Body mass index is 32.61 kg/m.  General: Cooperative, alert, well developed, in no acute distress. HEENT: Conjunctivae and lids unremarkable. Cardiovascular: Regular rhythm.  Lungs: Normal work of breathing. Neurologic: No focal deficits.   Lab Results  Component Value Date   CREATININE 0.92 02/15/2021   BUN 20 02/15/2021   NA 139 02/15/2021   K 4.3 02/15/2021   CL 100 02/15/2021   CO2 24 02/15/2021   Lab Results  Component Value Date   ALT 23 02/15/2021   AST 21 02/15/2021   ALKPHOS 112 02/15/2021   BILITOT 0.3 02/15/2021   Lab Results  Component Value Date   HGBA1C 6.2 (H) 02/15/2021   Lab Results  Component Value Date   INSULIN 10.8 02/15/2021   Lab Results  Component Value  Date   TSH 1.360 02/15/2021   Lab Results  Component Value Date   CHOL 212 (H) 02/15/2021   HDL 58 02/15/2021   LDLCALC 139 (H) 02/15/2021   TRIG 85 02/15/2021   CHOLHDL 3.7 02/15/2021   Lab Results  Component Value Date   VD25OH 42.7 02/15/2021   Lab Results  Component Value Date   WBC 7.5 02/15/2021   HGB 13.2 02/15/2021   HCT 39.0 02/15/2021   MCV 88 02/15/2021   PLT 325 02/15/2021   Lab Results  Component Value Date   IRON 50 02/15/2021   TIBC 284 02/15/2021   FERRITIN 74 02/15/2021    Attestation Statements:   Reviewed by clinician on day of visit: allergies, medications, problem list, medical history, surgical history, family history, social history, and previous encounter notes.  I, Wyatt Haste, LPN, am acting as transcriptionist for Briscoe Deutscher, DO  I have reviewed the above documentation for accuracy and completeness, and I agree with the above. -  Briscoe Deutscher, DO, MS, FAAFP, DABOM - Family and Bariatric Medicine.

## 2021-09-05 ENCOUNTER — Encounter (INDEPENDENT_AMBULATORY_CARE_PROVIDER_SITE_OTHER): Payer: Self-pay | Admitting: Family Medicine

## 2021-09-05 DIAGNOSIS — R632 Polyphagia: Secondary | ICD-10-CM

## 2021-09-09 MED ORDER — WEGOVY 1 MG/0.5ML ~~LOC~~ SOAJ: 1.0000 mg | SUBCUTANEOUS | 0 refills | Status: DC

## 2021-09-10 MED ORDER — WEGOVY 1 MG/0.5ML ~~LOC~~ SOAJ: 1.0000 mg | SUBCUTANEOUS | 0 refills | Status: DC

## 2021-09-10 NOTE — Addendum Note (Signed)
Addended by: Karren Cobble on: 09/10/2021 11:48 AM ? ? Modules accepted: Orders ? ?

## 2021-09-12 ENCOUNTER — Encounter (INDEPENDENT_AMBULATORY_CARE_PROVIDER_SITE_OTHER): Payer: Self-pay | Admitting: Family Medicine

## 2021-09-12 ENCOUNTER — Ambulatory Visit (INDEPENDENT_AMBULATORY_CARE_PROVIDER_SITE_OTHER): Payer: PRIVATE HEALTH INSURANCE | Admitting: Family Medicine

## 2021-09-12 MED ORDER — WEGOVY 0.5 MG/0.5ML ~~LOC~~ SOAJ: 0.5000 mg | SUBCUTANEOUS | 0 refills | Status: DC

## 2021-12-22 ENCOUNTER — Emergency Department (HOSPITAL_BASED_OUTPATIENT_CLINIC_OR_DEPARTMENT_OTHER): Payer: PRIVATE HEALTH INSURANCE

## 2021-12-22 ENCOUNTER — Emergency Department (HOSPITAL_BASED_OUTPATIENT_CLINIC_OR_DEPARTMENT_OTHER)
Admission: EM | Admit: 2021-12-22 | Discharge: 2021-12-23 | Disposition: A | Discharge status: Home / Self Care | Payer: PRIVATE HEALTH INSURANCE | Attending: Emergency Medicine | Admitting: Emergency Medicine

## 2021-12-22 ENCOUNTER — Encounter (HOSPITAL_BASED_OUTPATIENT_CLINIC_OR_DEPARTMENT_OTHER): Payer: Self-pay | Admitting: Emergency Medicine

## 2021-12-22 DIAGNOSIS — Z20822 Contact with and (suspected) exposure to covid-19: Secondary | ICD-10-CM | POA: Diagnosis not present

## 2021-12-22 DIAGNOSIS — R0789 Other chest pain: Secondary | ICD-10-CM | POA: Insufficient documentation

## 2021-12-22 DIAGNOSIS — K8020 Calculus of gallbladder without cholecystitis without obstruction: Secondary | ICD-10-CM | POA: Insufficient documentation

## 2021-12-22 DIAGNOSIS — Z79899 Other long term (current) drug therapy: Secondary | ICD-10-CM | POA: Diagnosis not present

## 2021-12-22 LAB — CBC
HCT: 42.4 % (ref 36.0–46.0)
Hemoglobin: 14.7 g/dL (ref 12.0–15.0)
MCH: 30.3 pg (ref 26.0–34.0)
MCHC: 34.7 g/dL (ref 30.0–36.0)
MCV: 87.4 fL (ref 80.0–100.0)
Platelets: 275 K/uL (ref 150–400)
RBC: 4.85 MIL/uL (ref 3.87–5.11)
RDW: 13.2 % (ref 11.5–15.5)
WBC: 8.7 K/uL (ref 4.0–10.5)
nRBC: 0 % (ref 0.0–0.2)

## 2021-12-22 LAB — BASIC METABOLIC PANEL WITH GFR
Anion gap: 9 (ref 5–15)
BUN: 20 mg/dL (ref 6–20)
CO2: 28 mmol/L (ref 22–32)
Calcium: 9.4 mg/dL (ref 8.9–10.3)
Chloride: 106 mmol/L (ref 98–111)
Creatinine, Ser: 0.86 mg/dL (ref 0.44–1.00)
GFR, Estimated: >60 mL/min
Glucose, Bld: 94 mg/dL (ref 70–99)
Potassium: 3.5 mmol/L (ref 3.5–5.1)
Sodium: 143 mmol/L (ref 135–145)

## 2021-12-22 LAB — URINALYSIS, ROUTINE W REFLEX MICROSCOPIC
Bilirubin Urine: NEGATIVE
Glucose, UA: NEGATIVE mg/dL
Hgb urine dipstick: NEGATIVE
Ketones, ur: NEGATIVE mg/dL
Leukocytes,Ua: NEGATIVE
Nitrite: NEGATIVE
Protein, ur: NEGATIVE mg/dL
Specific Gravity, Urine: 1.02 (ref 1.005–1.030)
pH: 7.5 (ref 5.0–8.0)

## 2021-12-22 LAB — TROPONIN I (HIGH SENSITIVITY)
Troponin I (High Sensitivity): 3 ng/L
Troponin I (High Sensitivity): 3 ng/L

## 2021-12-22 LAB — SARS CORONAVIRUS 2 BY RT PCR: SARS Coronavirus 2 by RT PCR: NEGATIVE

## 2021-12-22 MED ORDER — ONDANSETRON HCL 4 MG/2ML IJ SOLN
4.0000 mg | Freq: Once | INTRAMUSCULAR | Status: AC
  Administered 2021-12-22: 4 mg via INTRAVENOUS
  Filled 2021-12-22: qty 2

## 2021-12-22 NOTE — ED Provider Notes (Signed)
[facility] Provider Note   CSN: [id number] Arrival date & time: 12/22/21  1934     History  Chief Complaint  Patient presents with   Chest Pain    Samantha Castro is a 51 y.o. female.  51 year old female with no past medical history presents to the ED with a chief complaint of fatigue, chest pain for the past 2 days.  Patient reports symptoms began approximately 2 days ago, she is felt like she is have very little energy, has not been doing much.  She did take a nap this afternoon, reports she woke up with sudden onset of heaviness to the substernal area radiating through her back.  No alleviating or exacerbating factors.  She reports that the pain is severe in nature, was about an 8 prior to arrival in the ED.  She feels like "like your breast feel once you are breast-feeding and they are full, I feel that the way about 400 pounds ".  She did take some Advil which did help the pain bring down to a 5 out of 10.  She also endorses some chills, reports that she feels like she is cold all the time, has been sleeping with a heating pad in her bed.  Other does have a history of MI in his 1s.  She does not use any tobacco, no drug use, no prior history of CAD.  No prior history of blood clots.  No fever, shortness of breath or cough.   The history is provided by the patient, medical records and the spouse.  Chest Pain Pain location:  Substernal area Pain radiates to:  Mid back Onset quality:  Sudden Duration:  2 days Timing:  Intermittent Progression:  Partially resolved Chronicity:  New Context: not drug use   Relieved by:  Nothing Worsened by:  Nothing Associated symptoms: abdominal pain, fatigue and nausea   Associated symptoms: no fever, no numbness, no palpitations, no shortness of breath, no vomiting and no weakness        Home Medications Prior to Admission medications   Medication Sig Start Date End Date Taking? Authorizing Provider   amLODipine (NORVASC) 5 MG tablet Take by mouth. 08/13/20   [provider]  Biotin 10 MG CAPS Take by mouth.    [provider]  diclofenac (VOLTAREN) 50 MG EC tablet Take 50 mg by mouth 2 (two) times daily as needed. 12/11/20   [provider]  hydrOXYzine (ATARAX/VISTARIL) 10 MG tablet Take by mouth. 07/02/20   [provider]  linaclotide Rolan Lipa) 290 MCG CAPS capsule Take 1 capsule (290 mcg total) by mouth daily before breakfast. 05/30/21   Briscoe Deutscher, DO  LINZESS 145 MCG CAPS capsule Take 145 mcg by mouth daily. 02/11/21   [provider]  MELATONIN GUMMIES PO Take by mouth.    [provider]  Meth-Hyo-M Bl-Na Phos-Ph Sal (URIBEL) 118 MG CAPS Uribel 118 mg-10 mg-40.8 mg-36 mg capsule    [provider]  Misc. Devices MISC Initiate CPAP therapy at 10 cm. water pressure with EPR 3.  CPAP machine, mask and supplies for OSA.  F&P Eson II nasal mask or patient preference.  Send to [place]. 03/28/19   [provider]  nitrofurantoin (MACRODANTIN) 100 MG capsule Take 1 capsule by mouth at bedtime. 07/02/20   [provider]  Omega-3 Fatty Acids (ULTRA OMEGA 3 PO) Take 1,280 mg by mouth daily at 12 noon.    [provider]  OVER THE  COUNTER MEDICATION [employer] Optometrist, Historical, MD  OVER THE COUNTER MEDICATION Super beats heart chew    [provider]  oxybutynin (DITROPAN) 5 MG tablet Take 5 mg by mouth daily as needed for bladder spasms.    [provider]  pantoprazole (PROTONIX) 40 MG tablet Take 40 mg by mouth daily.    [provider]  Resveratrol-Quercetin (RESVERATROL PLUS PO) Take by mouth.    [provider]  Semaglutide-Weight Management (WEGOVY) 0.25 MG/0.5ML SOAJ Inject 0.25 mg into the skin once a week. 06/17/21   Briscoe Deutscher, DO  Semaglutide-Weight Management (WEGOVY) 0.5 MG/0.5ML SOAJ Inject 0.5 mg into the skin once a week.  09/12/21   Briscoe Deutscher, DO  Semaglutide-Weight Management (WEGOVY) 1 MG/0.5ML SOAJ Inject 1 mg into the skin once a week. 09/10/21   Briscoe Deutscher, DO  Semaglutide-Weight Management (WEGOVY) 1.7 MG/0.75ML SOAJ Inject 1.7 mg into the skin once a week. 07/09/21   Briscoe Deutscher, DO  Semaglutide-Weight Management (WEGOVY) 2.4 MG/0.75ML SOAJ Inject 2.4 mg into the skin once a week. 07/09/21   Briscoe Deutscher, DO      Allergies    Bacitracin-polymyxin b, Lisinopril, and Vancomycin    Review of Systems   Review of Systems  Constitutional:  Positive for fatigue. Negative for fever.  HENT:  Negative for sore throat.   Respiratory:  Negative for shortness of breath.   Cardiovascular:  Positive for chest pain. Negative for palpitations.  Gastrointestinal:  Positive for abdominal pain, diarrhea and nausea. Negative for vomiting.  Genitourinary:  Negative for flank pain.  Neurological:  Negative for weakness, light-headedness and numbness.  All other systems reviewed and are negative.   Physical Exam Updated Vital Signs BP (!) 159/95 (BP Location: Left Arm)   Pulse 67   Temp 98 F (36.7 C) (Oral)   Resp 18   Ht '5\' 4"'$  (1.626 m)   Wt 84.8 kg   SpO2 98%   BMI 32.10 kg/m  Physical Exam Vitals and nursing note reviewed.  Constitutional:      General: She is not in acute distress.    Appearance: She is well-developed.  HENT:     Head: Normocephalic and atraumatic.     Mouth/Throat:     Pharynx: No oropharyngeal exudate.  Eyes:     Pupils: Pupils are equal, round, and reactive to light.  Cardiovascular:     Rate and Rhythm: Regular rhythm.     Heart sounds: Normal heart sounds.  Pulmonary:     Effort: Pulmonary effort is normal. No respiratory distress.     Breath sounds: Normal breath sounds.     Comments: Clear to auscultation without any rales or wheezing. Abdominal:     General: Bowel sounds are decreased. There is no distension.     Palpations: Abdomen is soft.     Tenderness:  There is abdominal tenderness in the right upper quadrant and epigastric area. There is guarding. There is no right CVA tenderness or left CVA tenderness.     Comments: Bowel sounds are diminished throughout, there is some guarding noted.  Tenderness to palpation along the right upper quadrant and epigastric region.  Musculoskeletal:        General: No tenderness or deformity.     Cervical back: Normal range of motion.     Right lower leg: No edema.     Left lower leg: No edema.  Skin:    General: Skin is warm and dry.  Neurological:  Mental Status: She is alert and oriented to person, place, and time.     ED Results / Procedures / Treatments   Labs (all labs ordered are listed, but only abnormal results are displayed) Labs Reviewed  HEPATIC FUNCTION PANEL - Abnormal; Notable for the following components:      Result Value   AST 160 (*)    ALT 85 (*)    All other components within normal limits  SARS CORONAVIRUS 2 BY RT PCR  BASIC METABOLIC PANEL  CBC  URINALYSIS, ROUTINE W REFLEX MICROSCOPIC  TROPONIN I (HIGH SENSITIVITY)  TROPONIN I (HIGH SENSITIVITY)    EKG EKG Interpretation  Date/Time:  Sunday December 22 2021 19:43:35 EDT Ventricular Rate:  86 PR Interval:  144 QRS Duration: 78 QT Interval:  368 QTC Calculation: 440 R Axis:   82 Text Interpretation: Normal sinus rhythm Nonspecific T wave abnormality Abnormal ECG When compared with ECG of 11-Dec-2016 12:28, No acute changes Confirmed by Madalyn Rob [phone]) on 12/22/2021 10:45:34 PM  Radiology No results found.  Procedures Procedures    Medications Ordered in ED Medications  ondansetron (ZOFRAN) injection 4 mg (4 mg Intravenous Given 12/22/21 2050)  iohexol (OMNIPAQUE) 350 MG/ML injection 100 mL (100 mLs Intravenous Contrast Given 12/23/21 0153)  ondansetron (ZOFRAN) injection 4 mg (4 mg Intravenous Given 12/23/21 0235)    ED Course/ Medical Decision Making/ A&P                           Medical  Decision Making Amount and/or Complexity of Data Reviewed Labs: ordered. Radiology: ordered.  Risk Prescription drug management.    This patient presents to the ED for concern of chest pain, this involves a number of treatment options, and is a complaint that carries with it a high risk of complications and morbidity.  The differential diagnosis includes ACS, PE, versus dissection.    Co morbidities: Discussed in HPI   Brief History:  Patient with no PMH here with substernal chest pain that began while taking a nap today. Has felt unwell for the past 2 days.  Felt the pain was very severe in nature mostly came on describing it as a ripping sensation from her chest through her back. Did take some Advil without much improvement in symptoms.   EMR reviewed including pt PMHx, past surgical history and past visits to ER.   See HPI for more details   Lab Tests:  I ordered and independently interpreted labs.  The pertinent results include:    I personally reviewed all laboratory work and imaging. Metabolic panel without any acute abnormality specifically kidney function within normal limits and no significant electrolyte abnormalities. CBC without leukocytosis or significant anemia.   Imaging Studies:  NAD. I personally reviewed all imaging studies and no acute abnormality found. I agree with radiology interpretation.  Ultrasound of her gallbladder: Cholelithiasis with no acute cholecystitis.  Cardiac Monitoring:  The patient was maintained on a cardiac monitor.  I personally viewed and interpreted the cardiac monitored which showed an underlying rhythm of: NSR  EKG non-ischemic   Medicines ordered:  I ordered medication including zofran  for nausea Reevaluation of the patient after these medicines showed that the patient improved I have reviewed the patients home medicines and have made adjustments as needed   Reevaluation:  After the interventions noted above I  re-evaluated patient and found that they have :improved   Social Determinants of Health:  The patient's social determinants  of health were a factor in the care of this patient    Problem List / ED Course:  Patient here with substernal pressure that began while taking a nap, felt that the pain was very severe in nature describes it as a ripping feeling radiating onto her back.  Did take some Advil with some improvement in her symptoms.  She also endorses some chills, feels like she has been more fatigued than usual over the past 2 days.  She does not have any prior history of CAD.  Work-up here has been pretty benign.  BMP without any electrolyte derangements, hepatic function was added. CBC with no leukocytosis, did have some right upper quadrant pain therefore gallbladder etiology was considered, ultrasound of her right upper quadrant did not show any acute stones.  UA without any signs of infection no CVA tenderness.  She was given Zofran, offer pain medication here but refused.  EKG was nonischemic, troponin x 2 remain flat low suspicion for ACS. No risk factors aside from Family hx of CAD with brother with an MI in his 84's.  COVID-19 is also negative. Considered pulmonary embolism, however no hypoxia, no tachycardia and denies any shortness of breath.  Did arrive here hypertensive, sitter aortic dissection with ripping pain through her back attempted to obtain CT angio.  Unfortunately, imaging services are down at our facility.  We are unable to obtain imaging at this time therefore patient will need transfer ED to ED for CT angio.  I did discuss with patient if this imaging is negative she will likely need outpatient follow-up. I spoke to [facility] long team who accept transfer for imaging.    Dispostion:  Pending CT Angio r/o dissection.      Portions of this note were generated with Lobbyist. Dictation errors may occur despite best attempts at proofreading.   Final  Clinical Impression(s) / ED Diagnoses Final diagnoses:  Atypical chest pain  Calculus of gallbladder without cholecystitis without obstruction    Rx / DC Orders ED Discharge Orders     None         Janeece Fitting, PA-C 01/15/22 [id number]    Lucrezia Starch, MD 01/29/22 2056

## 2021-12-22 NOTE — ED Triage Notes (Signed)
Pt c/o diffuse CP w/ radiation to back since this afternoon at 1300; also reports nausea, diaphoresis

## 2021-12-22 NOTE — ED Notes (Signed)
[place] at bedside for US Liver/GB

## 2021-12-22 NOTE — ED Notes (Signed)
Radiology at bedside for port xray

## 2021-12-22 NOTE — ED Notes (Signed)
ED Provider at bedside. 

## 2021-12-23 ENCOUNTER — Emergency Department (HOSPITAL_COMMUNITY): Payer: PRIVATE HEALTH INSURANCE

## 2021-12-23 LAB — HEPATIC FUNCTION PANEL
ALT: 85 U/L — ABNORMAL HIGH (ref 0–44)
AST: 160 U/L — ABNORMAL HIGH (ref 15–41)
Albumin: 3.6 g/dL (ref 3.5–5.0)
Alkaline Phosphatase: 109 U/L (ref 38–126)
Bilirubin, Direct: 0.2 mg/dL (ref 0.0–0.2)
Indirect Bilirubin: 0.4 mg/dL (ref 0.3–0.9)
Total Bilirubin: 0.6 mg/dL (ref 0.3–1.2)
Total Protein: 6.7 g/dL (ref 6.5–8.1)

## 2021-12-23 MED ORDER — SODIUM CHLORIDE (PF) 0.9 % IJ SOLN
INTRAMUSCULAR | Status: AC
  Filled 2021-12-23: qty 50

## 2021-12-23 MED ORDER — ONDANSETRON HCL 4 MG/2ML IJ SOLN
4.0000 mg | Freq: Once | INTRAMUSCULAR | Status: AC
  Administered 2021-12-23: 4 mg via INTRAVENOUS
  Filled 2021-12-23: qty 2

## 2021-12-23 MED ORDER — IOHEXOL 350 MG/ML SOLN
100.0000 mL | Freq: Once | INTRAVENOUS | Status: AC | PRN
  Administered 2021-12-23: 100 mL via INTRAVENOUS

## 2021-12-23 NOTE — ED Notes (Signed)
I provided reinforced discharge education based off of discharge summary/care provided. Pt acknowledged and understood my education. Pt had no further questions/concerns for provider/myself.   

## 2021-12-23 NOTE — ED Notes (Signed)
[facility] called for report, ETA 20 min  

## 2021-12-23 NOTE — ED Provider Notes (Signed)
Patient sent from [facility] for CT chest to evaluate for aortic dissection.  She was seen for chest pain and 2 days of fatigue.  Work-up was unremarkable but the CT scanner at that facility is out of commission.  Nursing notes and vitals signs, including pulse oximetry, reviewed.  Summary of this visit's results, reviewed by myself:  EKG:  EKG Interpretation  Date/Time:  Sunday December 22 2021 19:43:35 EDT Ventricular Rate:  86 PR Interval:  144 QRS Duration: 78 QT Interval:  368 QTC Calculation: 440 R Axis:   82 Text Interpretation: Normal sinus rhythm Nonspecific T wave abnormality Abnormal ECG When compared with ECG of 11-Dec-2016 12:28, No acute changes Confirmed by Madalyn Rob [phone]) on 12/22/2021 10:45:34 PM        Labs:  Results for orders placed or performed during the hospital encounter of 12/22/21 (from the past 24 hour(s))  Basic metabolic panel     Status: None   Collection Time: 12/22/21  8:00 PM  Result Value Ref Range   Sodium 143 135 - 145 mmol/L   Potassium 3.5 3.5 - 5.1 mmol/L   Chloride 106 98 - 111 mmol/L   CO2 28 22 - 32 mmol/L   Glucose, Bld 94 70 - 99 mg/dL   BUN 20 6 - 20 mg/dL   Creatinine, Ser 0.86 0.44 - 1.00 mg/dL   Calcium 9.4 8.9 - 10.3 mg/dL   GFR, Estimated >60 >60 mL/min   Anion gap 9 5 - 15  CBC     Status: None   Collection Time: 12/22/21  8:00 PM  Result Value Ref Range   WBC 8.7 4.0 - 10.5 K/uL   RBC 4.85 3.87 - 5.11 MIL/uL   Hemoglobin 14.7 12.0 - 15.0 g/dL   HCT 42.4 36.0 - 46.0 %   MCV 87.4 80.0 - 100.0 fL   MCH 30.3 26.0 - 34.0 pg   MCHC 34.7 30.0 - 36.0 g/dL   RDW 13.2 11.5 - 15.5 %   Platelets 275 150 - 400 K/uL   nRBC 0.0 0.0 - 0.2 %  Troponin I (High Sensitivity)     Status: None   Collection Time: 12/22/21  8:00 PM  Result Value Ref Range   Troponin I (High Sensitivity) 3 <18 ng/L  SARS Coronavirus 2 by RT PCR (hospital order, performed in [facility] hospital lab) *cepheid single result test* Anterior  Nasal Swab     Status: None   Collection Time: 12/22/21  8:24 PM   Specimen: Anterior Nasal Swab  Result Value Ref Range   SARS Coronavirus 2 by RT PCR NEGATIVE NEGATIVE  Urinalysis, Routine w reflex microscopic Urine, Clean Catch     Status: None   Collection Time: 12/22/21  9:04 PM  Result Value Ref Range   Color, Urine YELLOW YELLOW   APPearance CLEAR CLEAR   Specific Gravity, Urine 1.020 1.005 - 1.030   pH 7.5 5.0 - 8.0   Glucose, UA NEGATIVE NEGATIVE mg/dL   Hgb urine dipstick NEGATIVE NEGATIVE   Bilirubin Urine NEGATIVE NEGATIVE   Ketones, ur NEGATIVE NEGATIVE mg/dL   Protein, ur NEGATIVE NEGATIVE mg/dL   Nitrite NEGATIVE NEGATIVE   Leukocytes,Ua NEGATIVE NEGATIVE  Troponin I (High Sensitivity)     Status: None   Collection Time: 12/22/21 10:04 PM  Result Value Ref Range   Troponin I (High Sensitivity) 3 <18 ng/L  Hepatic function panel     Status: Abnormal   Collection Time: 12/22/21 10:04 PM  Result  Value Ref Range   Total Protein 6.7 6.5 - 8.1 g/dL   Albumin 3.6 3.5 - 5.0 g/dL   AST 160 (H) 15 - 41 U/L   ALT 85 (H) 0 - 44 U/L   Alkaline Phosphatase 109 38 - 126 U/L   Total Bilirubin 0.6 0.3 - 1.2 mg/dL   Bilirubin, Direct 0.2 0.0 - 0.2 mg/dL   Indirect Bilirubin 0.4 0.3 - 0.9 mg/dL    Imaging Studies: CT Angio Chest/Abd/Pel for Dissection W and/or Wo Contrast  Result Date: 12/23/2021 CLINICAL DATA:  Chest pain EXAM: CT ANGIOGRAPHY CHEST, ABDOMEN AND PELVIS TECHNIQUE: Non-contrast CT of the chest was initially obtained. Multidetector CT imaging through the chest, abdomen and pelvis was performed using the standard protocol during bolus administration of intravenous contrast. Multiplanar reconstructed images and MIPs were obtained and reviewed to evaluate the vascular anatomy. RADIATION DOSE REDUCTION: This exam was performed according to the departmental dose-optimization program which includes automated exposure control, adjustment of the mA and/or kV according to  patient size and/or use of iterative reconstruction technique. CONTRAST:  14m OMNIPAQUE IOHEXOL 350 MG/ML SOLN COMPARISON:  None Available. FINDINGS: CTA CHEST FINDINGS Cardiovascular: --Heart: The heart size is normal.  There is nopericardial effusion. --Aorta: The course and caliber of the thoracic aorta are normal. There is no aortic atherosclerotic calcification. Precontrast images show no aortic intramural hematoma. There is no blood pool, dissection or penetrating ulcer demonstrated on arterial phase postcontrast imaging. There is a conventional 3 vessel aortic arch branching pattern. The proximal arch vessels are widely patent. --Pulmonary Arteries: Contrast timing is optimized for preferential opacification of the aorta. Within that limitation, normal central pulmonary arteries. Mediastinum/Nodes: No mediastinal, hilar or axillary lymphadenopathy. The visualized thyroid and thoracic esophageal course are unremarkable. Lungs/Pleura: No pulmonary nodules or masses. No pleural effusion or pneumothorax. No focal airspace consolidation. No focal pleural abnormality. Musculoskeletal: No chest wall abnormality. No acute osseous findings. Review of the MIP images confirms the above findings. CTA ABDOMEN AND PELVIS FINDINGS VASCULAR Aorta: Normal caliber aorta without aneurysm, dissection, vasculitis or hemodynamically significant stenosis. There is aortic atherosclerosis. Celiac: No aneurysm, dissection or hemodynamically significant stenosis. Normal branching pattern. SMA: Widely patent without dissection or stenosis. Renals: Single renal arteries bilaterally. No aneurysm, dissection, stenosis or evidence of fibromuscular dysplasia. IMA: Absent or occluded Inflow: No aneurysm, stenosis or dissection. Veins: Normal course and caliber of the major veins. Assessment is otherwise limited by the arterial dominant contrast phase. Review of the MIP images confirms the above findings. NON-VASCULAR Hepatobiliary: Normal  hepatic contours and density. No visible biliary dilatation. Normal gallbladder. Pancreas: Normal contours without ductal dilatation. No peripancreatic fluid collection. Spleen: Normal arterial phase splenic enhancement pattern. Adrenals/Urinary Tract: --Adrenal glands: Normal. --Right kidney/ureter: No hydronephrosis or perinephric stranding. No nephrolithiasis. No obstructing ureteral stones. --Left kidney/ureter: Mild atrophy. Multiple nonobstructing calculi that measure up to 4 mm. --Urinary bladder: Unremarkable. Stomach/Bowel: --Stomach/Duodenum: No hiatal hernia or other gastric abnormality. Normal duodenal course and caliber. --Small bowel: No dilatation or inflammation. --Colon: No focal abnormality. --Appendix: Surgically absent. Lymphatic:  No abdominal or pelvic lymphadenopathy. Reproductive: Status post hysterectomy. No adnexal mass. Musculoskeletal. No bony spinal canal stenosis or focal osseous abnormality. Other: None. Review of the MIP images confirms the above findings. IMPRESSION: 1. No acute aortic syndrome. 2. Left-sided nephrolithiasis without hydronephrosis. Aortic atherosclerosis (ICD10-I70.0). Electronically Signed   By: KUlyses JarredM.D.   On: 12/23/2021 02:14   [place]domen Limited RUQ (LIVER/GB)  Result Date: 12/22/2021 CLINICAL DATA:  [id number]. Right upper quadrant pain. Hysterectomy. Appendectomy. EXAM: ULTRASOUND ABDOMEN LIMITED RIGHT UPPER QUADRANT COMPARISON:  CT abdomen pelvis 05/04/2018 FINDINGS: Gallbladder: Gallstones within the gallbladder lumen. No gallbladder wall thickening or pericholecystic fluid visualized. No sonographic Murphy sign noted by sonographer. Common bile duct: Diameter: 3 mm. Liver: No focal lesion identified. Within normal limits in parenchymal echogenicity. Portal vein is patent on color Doppler imaging with normal direction of blood flow towards the liver. Other: None. IMPRESSION: Cholelithiasis with no acute cholecystitis. Electronically Signed   By:  Iven Finn M.D.   On: 12/22/2021 21:02   DG Chest Port 1 View  Result Date: 12/22/2021 CLINICAL DATA:  Diffuse chest pain. EXAM: PORTABLE CHEST 1 VIEW COMPARISON:  December 28, 2016 FINDINGS: The heart size and mediastinal contours are within normal limits. Both lungs are clear. The visualized skeletal structures are unremarkable. IMPRESSION: No active disease. Electronically Signed   By: Virgina Norfolk M.D.   On: 12/22/2021 20:19     Patient has epigastric tenderness but is in no distress.  Her pain is adequately controlled at this time.  She is not having difficulty breathing.  We will proceed with a CT angio chest,  abdomen and pelvis given that the pain is more centrally located and not isolated to the chest.  2:20 AM Patient denies pain at this time but is having a return of nausea.  We will repeat Zofran.  CT scan negative for acute aortic abnormality.  I suspect her pain may be due to biliary colic.  We will refer to [facility] surgery for consideration of elective cholecystectomy.    Attilio Zeitler, Jenny Reichmann, MD 12/23/21 [place]ogene [place]

## 2021-12-23 NOTE — ED Notes (Signed)
Report called to Maylon Cos, Therapist, sports at [place] [facility].  Pt stable for trasfer by [facility]

## 2022-01-08 ENCOUNTER — Encounter (INDEPENDENT_AMBULATORY_CARE_PROVIDER_SITE_OTHER): Payer: Self-pay

## 2022-09-24 ENCOUNTER — Emergency Department (HOSPITAL_COMMUNITY): Payer: PRIVATE HEALTH INSURANCE

## 2022-09-24 ENCOUNTER — Emergency Department (HOSPITAL_BASED_OUTPATIENT_CLINIC_OR_DEPARTMENT_OTHER): Payer: PRIVATE HEALTH INSURANCE

## 2022-09-24 ENCOUNTER — Other Ambulatory Visit: Payer: Self-pay

## 2022-09-24 ENCOUNTER — Observation Stay (HOSPITAL_BASED_OUTPATIENT_CLINIC_OR_DEPARTMENT_OTHER)
Admission: EM | Admit: 2022-09-24 | Discharge: 2022-09-25 | Disposition: A | Discharge status: Home / Self Care | Payer: PRIVATE HEALTH INSURANCE | Attending: Student in an Organized Health Care Education/Training Program | Admitting: Student in an Organized Health Care Education/Training Program

## 2022-09-24 ENCOUNTER — Encounter (HOSPITAL_BASED_OUTPATIENT_CLINIC_OR_DEPARTMENT_OTHER): Payer: Self-pay | Admitting: Emergency Medicine

## 2022-09-24 DIAGNOSIS — J45909 Unspecified asthma, uncomplicated: Secondary | ICD-10-CM | POA: Diagnosis not present

## 2022-09-24 DIAGNOSIS — I6381 Other cerebral infarction due to occlusion or stenosis of small artery: Secondary | ICD-10-CM | POA: Diagnosis not present

## 2022-09-24 DIAGNOSIS — I10 Essential (primary) hypertension: Secondary | ICD-10-CM | POA: Diagnosis present

## 2022-09-24 DIAGNOSIS — I639 Cerebral infarction, unspecified: Secondary | ICD-10-CM | POA: Diagnosis present

## 2022-09-24 DIAGNOSIS — Q283 Other malformations of cerebral vessels: Secondary | ICD-10-CM

## 2022-09-24 DIAGNOSIS — Z79899 Other long term (current) drug therapy: Secondary | ICD-10-CM | POA: Insufficient documentation

## 2022-09-24 DIAGNOSIS — R200 Anesthesia of skin: Secondary | ICD-10-CM | POA: Diagnosis present

## 2022-09-24 DIAGNOSIS — I6389 Other cerebral infarction: Secondary | ICD-10-CM | POA: Diagnosis not present

## 2022-09-24 DIAGNOSIS — G4733 Obstructive sleep apnea (adult) (pediatric): Secondary | ICD-10-CM | POA: Diagnosis present

## 2022-09-24 DIAGNOSIS — Z9049 Acquired absence of other specified parts of digestive tract: Secondary | ICD-10-CM | POA: Diagnosis not present

## 2022-09-24 DIAGNOSIS — Q049 Congenital malformation of brain, unspecified: Secondary | ICD-10-CM | POA: Diagnosis not present

## 2022-09-24 DIAGNOSIS — Z85828 Personal history of other malignant neoplasm of skin: Secondary | ICD-10-CM | POA: Diagnosis not present

## 2022-09-24 HISTORY — DX: Cerebral infarction, unspecified: I63.9

## 2022-09-24 LAB — DIFFERENTIAL
Abs Immature Granulocytes: 0.02 10*3/uL (ref 0.00–0.07)
Basophils Absolute: 0.1 10*3/uL (ref 0.0–0.1)
Basophils Relative: 1 %
Eosinophils Absolute: 0.3 10*3/uL (ref 0.0–0.5)
Eosinophils Relative: 4 %
Immature Granulocytes: 0 %
Lymphocytes Relative: 34 %
Lymphs Abs: 2.3 10*3/uL (ref 0.7–4.0)
Monocytes Absolute: 0.4 10*3/uL (ref 0.1–1.0)
Monocytes Relative: 5 %
Neutro Abs: 3.8 10*3/uL (ref 1.7–7.7)
Neutrophils Relative %: 56 %

## 2022-09-24 LAB — COMPREHENSIVE METABOLIC PANEL
ALT: 24 U/L (ref 0–44)
AST: 20 U/L (ref 15–41)
Albumin: 4 g/dL (ref 3.5–5.0)
Alkaline Phosphatase: 77 U/L (ref 38–126)
Anion gap: 10 (ref 5–15)
BUN: 32 mg/dL — ABNORMAL HIGH (ref 6–20)
CO2: 24 mmol/L (ref 22–32)
Calcium: 8.8 mg/dL — ABNORMAL LOW (ref 8.9–10.3)
Chloride: 104 mmol/L (ref 98–111)
Creatinine, Ser: 0.94 mg/dL (ref 0.44–1.00)
GFR, Estimated: >60 mL/min (ref >60)
Glucose, Bld: 137 mg/dL — ABNORMAL HIGH (ref 70–99)
Potassium: 3.5 mmol/L (ref 3.5–5.1)
Sodium: 138 mmol/L (ref 135–145)
Total Bilirubin: 0.5 mg/dL (ref 0.3–1.2)
Total Protein: 7.6 g/dL (ref 6.5–8.1)

## 2022-09-24 LAB — CBC
HCT: 38.5 % (ref 36.0–46.0)
Hemoglobin: 13.2 g/dL (ref 12.0–15.0)
MCH: 30.1 pg (ref 26.0–34.0)
MCHC: 34.3 g/dL (ref 30.0–36.0)
MCV: 87.9 fL (ref 80.0–100.0)
Platelets: 268 10*3/uL (ref 150–400)
RBC: 4.38 MIL/uL (ref 3.87–5.11)
RDW: 13 % (ref 11.5–15.5)
WBC: 6.8 10*3/uL (ref 4.0–10.5)
nRBC: 0 % (ref 0.0–0.2)

## 2022-09-24 LAB — APTT: aPTT: 30 seconds (ref 24–36)

## 2022-09-24 LAB — PROTIME-INR
INR: 0.9 (ref 0.8–1.2)
Prothrombin Time: 12.5 seconds (ref 11.4–15.2)

## 2022-09-24 LAB — ETHANOL: Alcohol, Ethyl (B): <10 mg/dL (ref <10)

## 2022-09-24 MED ORDER — IOHEXOL 350 MG/ML SOLN
75.0000 mL | Freq: Once | INTRAVENOUS | Status: AC | PRN
  Administered 2022-09-24: 75 mL via INTRAVENOUS

## 2022-09-24 MED ORDER — ACETAMINOPHEN 325 MG PO TABS
650.0000 mg | ORAL_TABLET | Freq: Four times a day (QID) | ORAL | Status: DC | PRN
  Administered 2022-09-24: 650 mg via ORAL
  Filled 2022-09-24: qty 2

## 2022-09-24 MED ORDER — ENOXAPARIN SODIUM 40 MG/0.4ML IJ SOSY
40.0000 mg | PREFILLED_SYRINGE | Freq: Every day | INTRAMUSCULAR | Status: DC
  Administered 2022-09-24 – 2022-09-25 (×2): 40 mg via SUBCUTANEOUS
  Filled 2022-09-24 (×2): qty 0.4

## 2022-09-24 MED ORDER — ASPIRIN 81 MG PO TBEC
81.0000 mg | DELAYED_RELEASE_TABLET | Freq: Every day | ORAL | Status: DC
  Administered 2022-09-24 – 2022-09-25 (×2): 81 mg via ORAL
  Filled 2022-09-24 (×2): qty 1

## 2022-09-24 MED ORDER — GADOBUTROL 1 MMOL/ML IV SOLN
10.0000 mL | Freq: Once | INTRAVENOUS | Status: AC | PRN
  Administered 2022-09-24: 10 mL via INTRAVENOUS

## 2022-09-24 MED ORDER — LINACLOTIDE 145 MCG PO CAPS
290.0000 ug | ORAL_CAPSULE | Freq: Every day | ORAL | Status: DC
  Administered 2022-09-25: 290 ug via ORAL
  Filled 2022-09-24: qty 2

## 2022-09-24 MED ORDER — LORAZEPAM 1 MG PO TABS
1.0000 mg | ORAL_TABLET | ORAL | Status: AC
  Administered 2022-09-24: 1 mg via ORAL
  Filled 2022-09-24: qty 1

## 2022-09-24 MED ORDER — ATORVASTATIN CALCIUM 40 MG PO TABS
40.0000 mg | ORAL_TABLET | Freq: Every day | ORAL | Status: DC
  Administered 2022-09-24 – 2022-09-25 (×2): 40 mg via ORAL
  Filled 2022-09-24 (×2): qty 1

## 2022-09-24 MED ORDER — ACETAMINOPHEN 650 MG RE SUPP: 650.0000 mg | Freq: Four times a day (QID) | RECTAL | Status: DC | PRN

## 2022-09-24 NOTE — Consult Note (Signed)
NEUROLOGY CONSULTATION NOTE   Date of service: September 24, 2022 Patient Name: Samantha Castro MRN:  [id number] DOB:  Aug 23, 1970 Reason for consult: "LUE and LLE numbness" Requesting Provider: Glyn Ade, MD _ _ _   _ __   _ __ _ _  __ __   _ __   __ _  History of Present Illness  Samantha Castro is a 52 y.o. female with PMH significant for GERd, endometriosis, HTN, nephrolithiasis who presents with LUE and LLE numbness. Left arm numb around 2000 on 09/23/22. Went to bed at 2200 and woke up at 0400 with LLE numbness now. Initially presented to [facility] ED and then transferred to [facility] where MRI Brain demonstrates a R thalamic stroke.  LKW: 2000 on 09/23/22. mRS: 0 tNKASE: not offered, too mild and outside window Thrombectomy: not offered, low suspicion for LVO. NIHSS components Score: Comment  1a Level of Conscious 0[x]  1[]  2[]  3[]      1b LOC Questions 0[x]  1[]  2[]       1c LOC Commands 0[x]  1[]  2[]       2 Best Gaze 0[x]  1[]  2[]       3 Visual 0[x]  1[]  2[]  3[]      4 Facial Palsy 0[x]  1[]  2[]  3[]      5a Motor Arm - left 0[x]  1[]  2[]  3[]  4[]  UN[]    5b Motor Arm - Right 0[x]  1[]  2[]  3[]  4[]  UN[]    6a Motor Leg - Left 0[x]  1[]  2[]  3[]  4[]  UN[]    6b Motor Leg - Right 0[x]  1[]  2[]  3[]  4[]  UN[]    7 Limb Ataxia 0[x]  1[]  2[]  3[]  UN[]     8 Sensory 0[]  1[x]  2[]  UN[]      9 Best Language 0[x]  1[]  2[]  3[]      10 Dysarthria 0[x]  1[]  2[]  UN[]      11 Extinct. and Inattention 0[x]  1[]  2[]       TOTAL: 1       ROS   Constitutional Denies weight loss, fever and chills.   HEENT Denies changes in vision and hearing.   Respiratory Denies SOB and cough.   CV Denies palpitations and CP   GI Denies abdominal pain, nausea, vomiting and diarrhea.   GU Denies dysuria and urinary frequency.   MSK Denies myalgia and joint pain.   Skin Denies rash and pruritus.   Neurological Denies headache and syncope.   Psychiatric Denies recent changes in mood. Denies anxiety and depression.    Past History    Past Medical History:  Diagnosis Date   Allergic rhinitis    Anemia    Asthma    Back pain    Constipation    Endometriosis    GERD (gastroesophageal reflux disease)    History of kidney stones    History of melanoma    Hypertension    Interstitial cystitis    Lower extremity edema    PONV (postoperative nausea and vomiting)    Sleep apnea    Past Surgical History:  Procedure Laterality Date   ABDOMINAL HYSTERECTOMY     APPENDECTOMY     ARTHROSCOPIC REPAIR ACL     COLON RESECTION     COLON SURGERY     EXCISION MELANOMA WITH SENTINEL LYMPH NODE BIOPSY Right 12/15/2016   Procedure: WIDE EXCISION RIGHT WRIST  MELANOMA WITH RIGHT SENTINEL NODE MAPPING;  Surgeon: Harriette Bouillon, MD;  Location: [facility];  Service: General;  Laterality: Right;   IRRIGATION AND DEBRIDEMENT ABSCESS Right 12/28/2016   Procedure: IRRIGATION AND  DEBRIDEMENT AXILLARY ABSCESS;  Surgeon: Manus Rudd, MD;  Location: [facility] OR;  Service: General;  Laterality: Right;   MELANOMA EXCISION     PELVIC LAPAROSCOPY     Family History  Problem Relation Age of Onset   Cancer Mother    Stroke Mother    Lung cancer Mother 30       deceased 100   Hypertension Mother    Colon cancer Mother        dx 55s   Lung cancer Father 55       deceased 57   Heart disease Father    Hypertension Father    Breast cancer Paternal Grandmother 89       deceased 90s   Lung cancer Maternal Uncle    Lung cancer Maternal Uncle    Pancreatic cancer Maternal Uncle    Breast cancer Paternal Aunt        dx 17s; currently 53s   Melanoma Cousin        deceased 66   Social History   Socioeconomic History   Marital status: Married    Spouse name: Not on file   Number of children: Not on file   Years of education: Not on file   Highest education level: Not on file  Occupational History   Occupation: RN  Tobacco Use   Smoking status: Never   Smokeless tobacco: Never  Vaping Use   Vaping Use: Never used   Substance and Sexual Activity   Alcohol use: No   Drug use: No   Sexual activity: Not on file  Other Topics Concern   Not on file  Social History Narrative   Not on file   Social Determinants of Health   Financial Resource Strain: Not on file  Food Insecurity: Not on file  Transportation Needs: Not on file  Physical Activity: Not on file  Stress: Not on file  Social Connections: Not on file   Allergies  Allergen Reactions   Bacitracin-Polymyxin B Rash   Vancomycin Hives and Rash   Lisinopril Other (See Comments) and Cough    Unknown  Other Reaction(s): cough, Other (See Comments)  Cough  Unknown  Cough  Cough, Unknown   Other Rash    PAPER/SILK TAPE OK   Tape Rash    PAPER/SILK TAPE OK   Wound Dressing Adhesive Rash    PAPER/SILK TAPE OK    Medications  (Not in a hospital admission)    Vitals   Vitals:   09/24/22 0554 09/24/22 0555 09/24/22 1103  BP: (!) 169/95  (!) 168/90  Pulse: 71  74  Resp: 18  16  Temp: 98.2 F (36.8 C)  98.2 F (36.8 C)  TempSrc:   Oral  SpO2: 100%  100%  Weight:  95.3 kg   Height:   (1.626 m)      Body mass index is 36.05 kg/m.  Physical Exam   General: Laying comfortably in bed; in no acute distress.  HENT: Normal oropharynx and mucosa. Normal external appearance of ears and nose.  Neck: Supple, no pain or tenderness  CV: No JVD. No peripheral edema.  Pulmonary: Symmetric Chest rise. Normal respiratory effort.  Abdomen: Soft to touch, non-tender.  Ext: No cyanosis, edema, or deformity  Skin: No rash. Normal palpation of skin.   Musculoskeletal: Normal digits and nails by inspection. No clubbing.   Neurologic Examination  Mental status/Cognition: Alert, oriented to self, place, month and year, good attention.  Speech/language: Fluent, comprehension intact, object  naming intact, repetition intact.  Cranial nerves:   CN II Pupils equal and reactive to light, no VF deficits   CN III,IV,VI EOM intact, no gaze  preference or deviation, no nystagmus    CN V normal sensation in V1, V2, and V3 segments bilaterally    CN VII no asymmetry, no nasolabial fold flattening    CN VIII normal hearing to speech    CN IX & X normal palatal elevation, no uvular deviation    CN XI 5/5 head turn and 5/5 shoulder shrug bilaterally    CN XII midline tongue protrusion    Motor:  Muscle bulk: normal, tone normal, pronator drift none tremor none Mvmt Root Nerve  Muscle Right Left Comments  SA C5/6 Ax Deltoid 5 5   EF C5/6 Mc Biceps 5 5   EE C6/7/8 Rad Triceps 5 5   WF C6/7 Med FCR     WE C7/8 PIN ECU     F Ab C8/T1 U ADM/FDI 5 5   HF L1/2/3 Fem Illopsoas 5 5   KE L2/3/4 Fem Quad 5 5   DF L4/5 D Peron Tib Ant 5 5   PF S1/2 Tibial Grc/Sol 5 5    Sensation:  Light touch Mildly decreased with tingling in LUE.   Pin prick    Temperature    Vibration   Proprioception    Coordination/Complex Motor:  - Finger to Nose intact BL - Heel to shin intact BL - Rapid alternating movement are normal - Gait: deferred.  Labs   CBC:  Recent Labs  Lab 09/24/22 0609  WBC 6.8  NEUTROABS 3.8  HGB 13.2  HCT 38.5  MCV 87.9  PLT 268    Basic Metabolic Panel:  Lab Results  Component Value Date   NA 138 09/24/2022   K 3.5 09/24/2022   CO2 24 09/24/2022   GLUCOSE 137 (H) 09/24/2022   BUN 32 (H) 09/24/2022   CREATININE 0.94 09/24/2022   CALCIUM 8.8 (L) 09/24/2022   GFRNONAA >60 09/24/2022   GFRAA >60 05/03/2018   Lipid Panel:  Lab Results  Component Value Date   LDLCALC 139 (H) 02/15/2021   HgbA1c:  Lab Results  Component Value Date   HGBA1C 6.2 (H) 02/15/2021   Urine Drug Screen: No results found for: "LABOPIA", "COCAINSCRNUR", "LABBENZ", "AMPHETMU", "THCU", "LABBARB"  Alcohol Level     Component Value Date/Time   ETH <10 09/24/2022 0610    CT Head without contrast(Personally reviewed): Small R frontal cavernoma.  CT angio Head and Neck with contrast(Personally reviewed): No LVO.  MRI  Brain(Personally reviewed): R thalamic stroke  Impression   Samantha Castro is a 52 y.o. female with PMH significant for GERD, endometriosis, HTN, nephrolithiasis, cavernoma who presents with LUE and LLE numbness. Found to have a small R thalamic stroke.   Recommendations  - Frequent Neuro checks per stroke unit protocol - Recommend obtaining TTE  - Recent HbA1c at [facility] from 08/19/22 was 5.7. no need to repeat. - Recent LDL at [facility] from 08/19/22 was 119. Start atorvastatin  daily. - Antithrombotic - Aspirin  daily alone only given hx of cavernoma. - Recommend DVT ppx - SBP goal - permissive hypertension first 24 h < 220/110. Hold home meds.  - Recommend Telemetry monitoring for arrythmia - Recommend bedside swallow screen prior to PO intake. - Stroke education booklet - Recommend PT/OT/SLP consult   ______________________________________________________________________   Thank you for the opportunity to take part in the care of this patient.  If you have any further questions, please contact the neurology consultation attending.  Signed,  Erick Blinks [facility] Pager Number [phone] _ _ _   _ __   _ __ _ _  __ __   _ __   __ _

## 2022-09-24 NOTE — H&P (Signed)
Date: 09/24/2022               Patient Name:  Samantha Castro MRN: [id number]  DOB: [birth year] Age / Sex: 52 y.o., female   PCP: Deloris Ping, MD         Medical Service: Internal Medicine Teaching Service         Attending Physician: Dr. Oswaldo Done, Marquita Palms, *    First Contact: Dr. Morene Crocker Pager: [phone]  Second Contact: Dr. Elza Rafter Pager: [phone]       After Hours (After 5p/  First Contact Pager: [phone]  weekends / holidays): Second Contact Pager: [phone]   Chief Complaint: left sided numbness  History of Present Illness:   Samantha Castro is a 52 year old female living with hypertension, cerebral venous malformation, obesity, hyperlipidemia, OSA on CPAP, who presents to the emergency room for left upper and lower extremity numbness.   Patient was in her usual state of health until 8 PM 4/23 which she developed left arm numbness, which she contributed to her radiculopathy.  She woke up at 4 AM this morning 4/24 and noticed left upper and lower extremity numbness.  She denies any arm or leg weakness but having issue with ambulation due to the loss of sensation.  She denies any dysphagia, speech difficulty or facial droop.  No change in vision reported.  Patient reports chronic headache that has been going on for about 3 weeks.  Headache is dull and diffuse.  She denies any photophobia, autophobia, visual changes or aura.  There was no relieving or triggering factors.  Tylenol and Advil did not help with the pain.  Pain is constant.  However her headache has stopped this morning and she is currently headache free.  She denies chest pain, shortness of breath, abdominal pain, nausea, vomiting, constipation, diarrhea, dysuria.  She report adherence to medications.  Patient reported finding of a cerebral cavernoma in February 2022 when she was admitted for kidney stone and also had headache.  She followed a neurologist at [facility] Dr. Langston Masker who  repeated a CT head which was unremarkable.  She stopped follow with neurology after that.  In the ED, patient was hypertensive at 169/95.  CBC was unremarkable.  CMP was largely unremarkable except for high BUN of 32.  MRI brain showed an acute right thalamic infarct.  Neurology was consulted.  I MTS consulted for admission.  Meds:  -Amlodipine 5 mg -Valsartan 160 mg -Pantoprazole -Linzess   Allergies: Allergies as of 09/24/2022 - Review Complete 09/24/2022  Allergen Reaction Noted   Bacitracin-polymyxin b Rash 11/11/2016   Vancomycin Hives and Rash 12/28/2016   Lisinopril Other (See Comments) and Cough 09/16/2011   Other Rash 09/03/2010   Tape Rash 09/03/2010   Wound dressing adhesive Rash 09/03/2010   Past Medical History:  Diagnosis Date   Allergic rhinitis    Anemia    Asthma    Back pain    Constipation    Endometriosis    GERD (gastroesophageal reflux disease)    History of kidney stones    History of melanoma    Hypertension    Interstitial cystitis    Lower extremity edema    PONV (postoperative nausea and vomiting)    Sleep apnea     Family History:  Family History  Problem Relation Age of Onset   Cancer Mother    Stroke Mother    Lung cancer Mother 53       deceased 48  Hypertension Mother    Colon cancer Mother        dx 52s   Lung cancer Father 13       deceased 60   Heart disease Father    Hypertension Father    Breast cancer Paternal Grandmother 38       deceased 90s   Lung cancer Maternal Uncle    Lung cancer Maternal Uncle    Pancreatic cancer Maternal Uncle    Breast cancer Paternal Aunt        dx 55s; currently 85s   Melanoma Cousin        deceased 61     Social History:  -Lives with her husband.  Independent with ADLs -PCP: Deloris Ping, MD, [facility] -Denied smoking, alcohol or drug use   Review of Systems: A complete ROS was negative except as per HPI.   Physical Exam: Blood pressure (!) 168/90, pulse  74, temperature 98.2 F (36.8 C), temperature source Oral, resp. rate 16, height  (1.626 m), weight 95.3 kg, SpO2 100 %. Physical Exam Constitutional:      Comments: Middle-aged women, in no acute distress  HENT:     Head: Normocephalic.  Eyes:     General:        Right eye: No discharge.        Left eye: No discharge.     Conjunctiva/sclera: Conjunctivae normal.  Cardiovascular:     Rate and Rhythm: Normal rate and regular rhythm.  Pulmonary:     Effort: Pulmonary effort is normal. No respiratory distress.     Breath sounds: Normal breath sounds. No wheezing.  Abdominal:     General: Bowel sounds are normal. There is no distension.     Palpations: Abdomen is soft.     Tenderness: There is no abdominal tenderness.  Musculoskeletal:        General: Normal range of motion.     Cervical back: Normal range of motion.     Right lower leg: No edema.     Left lower leg: No edema.  Skin:    General: Skin is warm.     Coloration: Skin is not jaundiced.  Neurological:     Mental Status: She is alert and oriented to person, place, and time.     Comments: PERRLA EOM intact Cranial nerve none deficit 5/5 strength of bilateral upper and lower extremity Reduced sensation of left upper and lower extremity Normal finger-to-nose test Normal rapid movement test  Psychiatric:        Mood and Affect: Mood normal.      EKG: personally reviewed my interpretation is normal sinus rhythm  Assessment & Plan by Problem: Principal Problem:   Acute CVA (cerebrovascular accident) Active Problems:   Essential hypertension   Cerebral cavernoma   OSA (obstructive sleep apnea)  Samantha Castro is a 52 year old female living with hypertension, cerebral venous malformation, obesity, hyperlipidemia, OSA on CPAP, who presents to the emergency room for left upper and lower extremity numbness, found to have an acute right thalamic infarct.  Acute right thalamic infarct Chronic cerebral venous  malformation Her sensory deficits can be explained by the acute thalamic infarct.  This is likely a small vessel disease.  CTA head and neck did not show any large vessel disease.   The finding of small hyperdensity in the right centrum semiovale seems to be a chronic venous anomaly.  This was also shown on the MRI in 2022 that appears unchanged. There is  no indication for surgical removal without strong evidence of active bleed or symptoms.  Recommend monitoring outpatient with a neurologist or neurosurgeon.  -Appreciate neurology recommendation -Pending echocardiogram -Start aspirin 81 mg.  Avoid DAPT due to venous abnormally -LDL 119 08/19/2022, start Lipitor 40 mg -A1c 5.7 08/19/2022, no indication for treatment -PT/OT/SLP -Permissive hypertension in the next 24-48 hours.  Holding home blood pressure medications -Telemetry  Hypertension -Holding home amlodipine and valsartan  OSA -Resume CPAP nightly  Constipation History of colectomy Patient reported history of colectomy due to an endometrial mass.   -Resume home Linzess  Full code Diet: Regular DVT: Lovenox IVF: N/A  Dispo: Admit patient to Observation with expected length of stay less than 2 midnights.  SignedDoran Stabler, DO 09/24/2022, 12:46 PM  Pager: [phone] After 5pm on weekdays and 1pm on weekends: On Call pager: [phone]

## 2022-09-24 NOTE — ED Provider Notes (Signed)
[facility] Provider Note: Samantha Dell, MD, FACEP  CSN: [id number] MRN: [id number] ARRIVAL: 09/24/22 at 0544 ROOM: MH10/[facility]10   CHIEF COMPLAINT  Numbness   HISTORY OF PRESENT ILLNESS  09/24/22 5:56 AM Samantha Castro is a 52 y.o. female who has been having headaches for the past several weeks.  She does not usually get headaches.  These headaches but have been fairly severe and have involved her entire head.  She has not had a headache in about a day.  Yesterday evening about 8 PM she noticed paresthesias of the left arm.  The paresthesias involve the entire arm and she describes it as the pins and needle sensation as if her arm had gone to sleep.  She woke up this morning about 4:30 AM and the paresthesias have spread to the entire left side of her body.  She is not having any headache currently.  She is not having any motor deficits.  She is not having any visual changes.   Past Medical History:  Diagnosis Date   Allergic rhinitis    Anemia    Asthma    Back pain    Constipation    Endometriosis    GERD (gastroesophageal reflux disease)    History of kidney stones    History of melanoma    Hypertension    Interstitial cystitis    Lower extremity edema    PONV (postoperative nausea and vomiting)    Sleep apnea     Past Surgical History:  Procedure Laterality Date   ABDOMINAL HYSTERECTOMY     APPENDECTOMY     ARTHROSCOPIC REPAIR ACL     COLON RESECTION     COLON SURGERY     EXCISION MELANOMA WITH SENTINEL LYMPH NODE BIOPSY Right 12/15/2016   Procedure: WIDE EXCISION RIGHT WRIST  MELANOMA WITH RIGHT SENTINEL NODE MAPPING;  Surgeon: Harriette Bouillon, MD;  Location: [facility];  Service: General;  Laterality: Right;   IRRIGATION AND DEBRIDEMENT ABSCESS Right 12/28/2016   Procedure: IRRIGATION AND DEBRIDEMENT AXILLARY ABSCESS;  Surgeon: Manus Rudd, MD;  Location: [facility];  Service: General;  Laterality: Right;   MELANOMA EXCISION     PELVIC  LAPAROSCOPY      Family History  Problem Relation Age of Onset   Cancer Mother    Stroke Mother    Lung cancer Mother 46       deceased 47   Hypertension Mother    Colon cancer Mother        dx 87s   Lung cancer Father 46       deceased 94   Heart disease Father    Hypertension Father    Breast cancer Paternal Grandmother 43       deceased 90s   Lung cancer Maternal Uncle    Lung cancer Maternal Uncle    Pancreatic cancer Maternal Uncle    Breast cancer Paternal Aunt        dx 14s; currently 53s   Melanoma Cousin        deceased 35    Social History   Tobacco Use   Smoking status: Never   Smokeless tobacco: Never  Vaping Use   Vaping Use: Never used  Substance Use Topics   Alcohol use: No   Drug use: No    Prior to Admission medications   Medication Sig Start Date End Date Taking? Authorizing Provider  amLODipine (NORVASC) 5 MG tablet Take by mouth. 08/13/20  Yes [provider]  Biotin 10 MG CAPS Take by mouth.   Yes [provider]  diclofenac (VOLTAREN) 50 MG EC tablet Take 50 mg by mouth 2 (two) times daily as needed. 12/11/20  Yes [provider]  gabapentin (NEURONTIN) 100 MG capsule Take by mouth. 03/24/22 07/29/23 Yes [provider]  linaclotide Karlene Einstein) 290 MCG CAPS capsule Take 1 capsule (290 mcg total) by mouth daily before breakfast. 05/30/21  Yes Helane Rima, DO  Misc. Devices MISC Initiate CPAP therapy at 10 cm. water pressure with EPR 3.  CPAP machine, mask and supplies for OSA.  F&P Eson II nasal mask or patient preference.  Send to [place]. 03/28/19  Yes [provider]  OVER THE COUNTER MEDICATION Turbo Power Vitamin/Super Immunity   Yes [provider]  pantoprazole (PROTONIX) 40 MG tablet Take 40 mg by mouth daily.   Yes [provider]  valsartan (DIOVAN) 160 MG tablet Take by mouth. 08/28/22  Yes [provider]  hydrOXYzine (ATARAX/VISTARIL) 10 MG tablet Take 10 mg by mouth  continuous as needed for itching or anxiety (Use as needed for flare). 07/02/20   [provider]  Meth-Hyo-M Bl-Na Phos-Ph Sal (URIBEL) 118 MG CAPS Take 118 mg by mouth continuous as needed (Only as needed for flare).    [provider]  oxybutynin (DITROPAN) 5 MG tablet Take 5 mg by mouth daily as needed for bladder spasms.    [provider]    Allergies Bacitracin-polymyxin b, Vancomycin, Lisinopril, Other, Tape, and Wound dressing adhesive   REVIEW OF SYSTEMS  Negative except as noted here or in the History of Present Illness.   PHYSICAL EXAMINATION  Initial Vital Signs Blood pressure (!) 169/95, pulse 71, temperature 98.2 F (36.8 C), resp. rate 18, height 5\' 4"  (1.626 m), weight 95.3 kg, SpO2 100 %.  Examination General: Well-developed, well-nourished female in no acute distress; appearance consistent with age of record HENT: normocephalic; atraumatic Eyes: pupils equal, round and reactive to light; extraocular muscles intact Neck: supple; no bruit Heart: regular rate and rhythm Lungs: clear to auscultation bilaterally Abdomen: soft; nondistended; nontender; bowel sounds present Extremities: No deformity; full range of motion; pulses normal Neurologic: Awake, alert and oriented; motor function intact in all extremities and symmetric; altered sensation on left side of body; normal finger-nose; no pronator droop; normal speech; no facial droop Skin: Warm and dry Psychiatric: Normal mood and affect   RESULTS  Summary of this visit's results, reviewed and interpreted by myself:   EKG Interpretation  Date/Time:  Wednesday September 24 2022 06:12:08 EDT Ventricular Rate:  70 PR Interval:  165 QRS Duration: 102 QT Interval:  427 QTC Calculation: 461 R Axis:   51 Text Interpretation: Sinus rhythm Low voltage, precordial leads Borderline T abnormalities, anterior leads No significant change was found Confirmed by Dyan Creelman, Jonny Ruiz ([id number]) on 09/24/2022  6:15:01 AM       Laboratory Studies: Results for orders placed or performed during the hospital encounter of 09/24/22 (from the past 24 hour(s))  Protime-INR     Status: None   Collection Time: 09/24/22  6:09 AM  Result Value Ref Range   Prothrombin Time 12.5 11.4 - 15.2 seconds   INR 0.9 0.8 - 1.2  APTT     Status: None   Collection Time: 09/24/22  6:09 AM  Result Value Ref Range   aPTT 30 24 - 36 seconds  CBC     Status: None   Collection Time: 09/24/22  6:09 AM  Result Value Ref  Range   WBC 6.8 4.0 - 10.5 K/uL   RBC 4.38 3.87 - 5.11 MIL/uL   Hemoglobin 13.2 12.0 - 15.0 g/dL   HCT 16.1 09.6 - 04.5 %   MCV 87.9 80.0 - 100.0 fL   MCH 30.1 26.0 - 34.0 pg   MCHC 34.3 30.0 - 36.0 g/dL   RDW 40.9 81.1 - 91.4 %   Platelets 268 150 - 400 K/uL   nRBC 0.0 0.0 - 0.2 %  Differential     Status: None   Collection Time: 09/24/22  6:09 AM  Result Value Ref Range   Neutrophils Relative % 56 %   Neutro Abs 3.8 1.7 - 7.7 K/uL   Lymphocytes Relative 34 %   Lymphs Abs 2.3 0.7 - 4.0 K/uL   Monocytes Relative 5 %   Monocytes Absolute 0.4 0.1 - 1.0 K/uL   Eosinophils Relative 4 %   Eosinophils Absolute 0.3 0.0 - 0.5 K/uL   Basophils Relative 1 %   Basophils Absolute 0.1 0.0 - 0.1 K/uL   Immature Granulocytes 0 %   Abs Immature Granulocytes 0.02 0.00 - 0.07 K/uL  Comprehensive metabolic panel     Status: Abnormal   Collection Time: 09/24/22  6:09 AM  Result Value Ref Range   Sodium 138 135 - 145 mmol/L   Potassium 3.5 3.5 - 5.1 mmol/L   Chloride 104 98 - 111 mmol/L   CO2 24 22 - 32 mmol/L   Glucose, Bld 137 (H) 70 - 99 mg/dL   BUN 32 (H) 6 - 20 mg/dL   Creatinine, Ser 7.82 0.44 - 1.00 mg/dL   Calcium 8.8 (L) 8.9 - 10.3 mg/dL   Total Protein 7.6 6.5 - 8.1 g/dL   Albumin 4.0 3.5 - 5.0 g/dL   AST 20 15 - 41 U/L   ALT 24 0 - 44 U/L   Alkaline Phosphatase 77 38 - 126 U/L   Total Bilirubin 0.5 0.3 - 1.2 mg/dL   GFR, Estimated >95 >62 mL/min   Anion gap 10 5 - 15  Ethanol      Status: None   Collection Time: 09/24/22  6:10 AM  Result Value Ref Range   Alcohol, Ethyl (B) <10 <10 mg/dL   Imaging Studies: MR Cervical Spine W and Wo Contrast  Result Date: 09/24/2022 CLINICAL DATA:  Left upper extremity and lower extremity numbness EXAM: MRI CERVICAL SPINE WITHOUT AND WITH CONTRAST TECHNIQUE: Multiplanar and multiecho pulse sequences of the cervical spine, to include the craniocervical junction and cervicothoracic junction, were obtained without and with intravenous contrast. CONTRAST:  10mL GADAVIST GADOBUTROL 1 MMOL/ML IV SOLN COMPARISON:  MRI cervical spine March 24, 2022 FINDINGS: Alignment: Straightening.  No substantial sagittal subluxation. Vertebrae: No fracture, evidence of discitis, or bone lesion. No abnormal enhancement. Cord: Normal cord signal.  No abnormal enhancement. Posterior Fossa, vertebral arteries, paraspinal tissues: Visualized vertebral artery flow voids are maintained. Disc levels: C2-C3: No significant disc protrusion, foraminal stenosis, or canal stenosis. C3-C4: No significant disc protrusion, foraminal stenosis, or canal stenosis. C4-C5: No significant disc protrusion, foraminal stenosis, or canal stenosis. C5-C6: No significant disc protrusion, foraminal stenosis, or canal stenosis. C6-C7: Similar mild to moderate left foraminal stenosis due to left foraminal disc protrusion. Patent canal and right foramen. C7-T1: No significant disc protrusion, foraminal stenosis, or canal stenosis. IMPRESSION: Similar mild to moderate left foraminal stenosis C6-C7 due to left foraminal disc protrusion. Electronically Signed   By: Feliberto Harts M.D.   On: 09/24/2022 10:45  MR Brain W and Wo Contrast  Result Date: 09/24/2022 CLINICAL DATA:  Left upper and lower extremity numbness. Some difficulty walking. Abnormal CT exam. EXAM: MRI HEAD WITHOUT AND WITH CONTRAST TECHNIQUE: Multiplanar, multiecho pulse sequences of the brain and surrounding structures were  obtained without and with intravenous contrast. CONTRAST:  10mL GADAVIST GADOBUTROL 1 MMOL/ML IV SOLN COMPARISON:  CT studies earlier same day.  MRI 10/05/2008. FINDINGS: Brain: Diffusion imaging shows a punctate acute infarction in the lateral right thalamus, quite likely responsible for the presenting symptoms. No abnormality affects the brainstem or cerebellum otherwise. Cerebral hemispheres show a few scattered foci of T2 and FLAIR signal within the white matter consistent with minimal small vessel change, progressive since 2010. Small hyperdensity seen in the right centrum semiovale region shows susceptibility artifact with mild surrounding T2 and FLAIR signal. In 2010, there was a tiny T2 abnormality in this region. 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 I cannot completely rule out some acute hemorrhage or thrombotic change in this region presently, but think that the clinical presentation could be explained entirely by the right thalamic infarction, and a second acute abnormality would be quite coincidental. No cortical abnormality. No hydrocephalus or extra-axial collection. Vascular: Major vessels at the base of the brain show flow. Skull and upper cervical spine: Negative Sinuses/Orbits: Clear/normal Other: None IMPRESSION: 1. 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 2. Minimal small-vessel change of the cerebral hemispheric white matter, progressive since 2010. 3. Small hyperdensity seen in the right centrum semiovale region by CT shows susceptibility artifact with mild surrounding T2 and FLAIR signal. In 2010, there was a tiny T2 abnormality in this region. 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 I cannot completely rule  out some acute hemorrhage or thrombotic change in this region presently, but think that the clinical presentation could be explained entirely by the right thalamic infarction, and a second acute abnormality would be quite coincidental. Electronically Signed   By: Paulina Fusi M.D.   On: 09/24/2022 10:37   DG Chest Portable 1 View  Result Date: 09/24/2022 CLINICAL DATA:  Stroke workup EXAM: PORTABLE CHEST 1 VIEW COMPARISON:  12/22/2021 FINDINGS: Artifact from EKG leads. Normal heart size and mediastinal contours for technique. No acute infiltrate or edema. No effusion or pneumothorax. No acute osseous findings. IMPRESSION: No active disease. Electronically Signed   By: Tiburcio Pea M.D.   On: 09/24/2022 07:32   CT ANGIO HEAD NECK W WO CM  Result Date: 09/24/2022 CLINICAL DATA:  Left arm numbness beginning 10 hours ago now with left arm and leg numbness beginning 2 hours ago. EXAM: CT ANGIOGRAPHY HEAD AND NECK WITH AND WITHOUT CONTRAST TECHNIQUE: Multidetector CT imaging of the head and neck was performed using the standard protocol during bolus administration of intravenous contrast. Multiplanar CT image reconstructions and MIPs were obtained to evaluate the vascular anatomy. Carotid stenosis measurements (when applicable) are obtained utilizing NASCET criteria, using the distal internal carotid diameter as the denominator. RADIATION DOSE REDUCTION: This exam was performed according to the departmental dose-optimization program which includes automated exposure control, adjustment of the mA and/or kV according to patient size and/or use of iterative reconstruction technique. CONTRAST:  75mL OMNIPAQUE IOHEXOL 350 MG/ML SOLN COMPARISON:  None Available. FINDINGS: CT  HEAD FINDINGS Brain: A 4 mm high-density areas subtly seen in the right centrum semiovale, most convincing on reformats. No evidence of gray matter infarct, hydrocephalus, or collection. Vascular: No hyperdense vessel or unexpected calcification.  Skull: Normal. Negative for fracture or focal lesion. Sinuses/Orbits: No acute finding. Review of the MIP images confirms the above findings CTA NECK FINDINGS Aortic arch: 3 vessel branching. Right carotid system: Vessels are smoothly contoured and widely patent. No atheromatous changes noted peer Left carotid system: Vessels are smoothly contoured and widely patent. Vertebral arteries: The subclavian and vertebral arteries are smoothly contoured and widely patent. No branch occlusion, beading, or aneurysm. Skeleton: No acute or aggressive finding Other neck: Negative Upper chest: Clear apical lungs Review of the MIP images confirms the above findings CTA HEAD FINDINGS Anterior circulation: No significant stenosis, proximal occlusion, aneurysm, or vascular malformation. Posterior circulation: No significant stenosis, proximal occlusion, aneurysm, or vascular malformation. Venous sinuses: Diffusely patent Anatomic variants: None significant Review of the MIP images confirms the above findings IMPRESSION: 1. 4 mm high-density area in the right centrum semiovale small bleed, calcification, or cavernoma could give this appearance. Especially given the symptoms, recommend brain MRI with contrast. 2. Negative CTA of the head and neck. Electronically Signed   By: Tiburcio Pea M.D.   On: 09/24/2022 07:12    ED COURSE and MDM  Nursing notes, initial and subsequent vitals signs, including pulse oximetry, reviewed and interpreted by myself.  Vitals:   09/24/22 0554 09/24/22 0555 09/24/22 1103 09/24/22 1646  BP: (!) 169/95  (!) 168/90 (!) 154/83  Pulse: 71  74 68  Resp: Temp: 98.2 F (36.8 C)  98.2 F (36.8 C) 98 F (36.7 C)  TempSrc:   Oral Oral  SpO2: 100%  100% 100%  Weight:  95.3 kg    Height:   (1.626 m)     Medications  iohexol (OMNIPAQUE) 350 MG/ML injection 75 mL (has no administration in time range)   6:01 AM Patient outside the window for code stroke.  Stroke protocol  ordered.  6:45 AM Signed out to Dr. Eloise Harman. Diagnostic studies pending.   PROCEDURES  Procedures   ED DIAGNOSES     ICD-10-CM   1. Cerebrovascular accident (CVA), unspecified mechanism  I63.9          Analeia Ismael, MD 09/24/22 2242

## 2022-09-24 NOTE — ED Notes (Signed)
Attempted to call MRI at [facility] to inform of patient arrival, no answer

## 2022-09-24 NOTE — ED Notes (Signed)
Report given to Britt Boozer, Press photographer at [facility]. IV secured for patient transfer. Instructed to go straight to ED at patient is traveling POV with husband. Pt and husband verbalize understanding. NAD at time of transfer, alert, oriented and ambulatory.

## 2022-09-24 NOTE — ED Provider Notes (Signed)
  Physical Exam  BP (!) 169/95   Pulse 71   Temp 98.2 F (36.8 C)   Resp 18   Ht  (1.626 m)   Wt 95.3 kg   SpO2 100%   BMI 36.05 kg/m   Physical Exam  Procedures  Procedures  ED Course / MDM   Clinical Course as of 09/24/22 0813  Wed Sep 24, 2022  0648 Assumed care from Dr Read Drivers. 52 yo F with hx of HTN and cervical radiculopathy who went to bed at 8pm with L upper numbness at at 0430 today also had LLE numbness no motor deficits. However, husband noted that she was having some difficulty walking.  Possible facial involvement. 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 Also has been having headaches recently.  On my repeat exam has no focal weakness.  Has decreased sensation to light touch and pinprick on the left upper extremity and left lower extremity but intact sensation in the face. [RP]  1610 Dr Selina Cooley from neurology recommends MRI brain and c-spine WWO contrast.  [RP]  [phone] Dr Coralee Pesa from [facility] accepts the patient for ED to ED transfer to facilitate her MRI. [RP]  [phone] Patient sent via POV to [facility]. [RP]    Clinical Course User Index [RP] Rondel Baton, MD   Medical Decision Making Amount and/or Complexity of Data Reviewed Labs: ordered. Radiology: ordered.  Risk Prescription drug management.      Rondel Baton, MD 09/24/22 [phone]

## 2022-09-24 NOTE — ED Notes (Signed)
ED TO INPATIENT HANDOFF REPORT  ED Nurse Name and Phone #: Redmond Pulling [phone]  S Name/Age/Gender Samantha Castro 52 y.o. female Room/Bed: H011C/H011C  Code Status   Code Status: Full Code  Home/SNF/Other Home Patient oriented to: self, place, time, and situation Is this baseline? Yes   Triage Complete: Triage complete  Chief Complaint Acute CVA (cerebrovascular accident) [I63.9]  Triage Note Arrives POV with significant other.   Reports left arm numbness beginning ~8PM last night. Went to bed around 11PM. Woke up at 4AM with left arm, and left leg numbness.   Checked bp and was 177/104. Has ongoing headaches.   Says she was seen in the past with similar sx and was diagnosed with a cerebral cavernoma.    Allergies Allergies  Allergen Reactions   Bacitracin-Polymyxin B Rash   Vancomycin Hives and Rash   Lisinopril Other (See Comments) and Cough    Unknown  Other Reaction(s): cough, Other (See Comments)  Cough  Unknown  Cough  Cough, Unknown   Other Rash    PAPER/SILK TAPE OK   Tape Rash    PAPER/SILK TAPE OK   Wound Dressing Adhesive Rash    PAPER/SILK TAPE OK    Level of Care/Admitting Diagnosis ED Disposition     ED Disposition  Admit   Condition  --   Comment  Hospital Area: [facility] [[id number]]  Level of Care: Telemetry Medical [104]  May place patient in observation at [facility] or [facility] if equivalent level of care is available:: No  Covid Evaluation: Asymptomatic - no recent exposure (last 10 days) testing not required  Diagnosis: Acute CVA (cerebrovascular accident) [[id number]]  Admitting Physician: Tyson Alias [[id number]]  Attending Physician: Tyson Alias [phone]          B Medical/Surgery History Past Medical History:  Diagnosis Date   Allergic rhinitis    Anemia    Asthma    Back pain    Constipation    Endometriosis    GERD (gastroesophageal reflux disease)    History of  kidney stones    History of melanoma    Hypertension    Interstitial cystitis    Lower extremity edema    PONV (postoperative nausea and vomiting)    Sleep apnea    Past Surgical History:  Procedure Laterality Date   ABDOMINAL HYSTERECTOMY     APPENDECTOMY     ARTHROSCOPIC REPAIR ACL     COLON RESECTION     COLON SURGERY     EXCISION MELANOMA WITH SENTINEL LYMPH NODE BIOPSY Right 12/15/2016   Procedure: WIDE EXCISION RIGHT WRIST  MELANOMA WITH RIGHT SENTINEL NODE MAPPING;  Surgeon: Harriette Bouillon, MD;  Location: [facility];  Service: General;  Laterality: Right;   IRRIGATION AND DEBRIDEMENT ABSCESS Right 12/28/2016   Procedure: IRRIGATION AND DEBRIDEMENT AXILLARY ABSCESS;  Surgeon: Manus Rudd, MD;  Location: [facility] OR;  Service: General;  Laterality: Right;   MELANOMA EXCISION     PELVIC LAPAROSCOPY       A IV Location/Drains/Wounds Patient Lines/Drains/Airways Status     Active Line/Drains/Airways     Name Placement date Placement time Site Days   Peripheral IV 09/24/22 20 G 1" Right Antecubital 09/24/22  0612  Antecubital  less than 1            Intake/Output Last 24 hours No intake or output data in the 24 hours ending 09/24/22 1605  Labs/Imaging Results for orders placed or performed during the  hospital encounter of 09/24/22 (from the past 48 hour(s))  Protime-INR     Status: None   Collection Time: 09/24/22  6:09 AM  Result Value Ref Range   Prothrombin Time 12.5 11.4 - 15.2 seconds   INR 0.9 0.8 - 1.2    Comment: (NOTE) INR goal varies based on device and disease states. Performed at [facility], [address] [place], [place] [address]   APTT     Status: None   Collection Time: 09/24/22  6:09 AM  Result Value Ref Range   aPTT 30 24 - 36 seconds    Comment: Performed at [facility], [address] [place], [place] [address]  CBC     Status: None   Collection Time: 09/24/22  6:09 AM  Result Value Ref Range    WBC 6.8 4.0 - 10.5 K/uL   RBC 4.38 3.87 - 5.11 MIL/uL   Hemoglobin 13.2 12.0 - 15.0 g/dL   HCT 65.7 84.6 - 96.2 %   MCV 87.9 80.0 - 100.0 fL   MCH 30.1 26.0 - 34.0 pg   MCHC 34.3 30.0 - 36.0 g/dL   RDW 95.2 84.1 - 32.4 %   Platelets 268 150 - 400 K/uL   nRBC 0.0 0.0 - 0.2 %    Comment: Performed at [facility], [address] [place], [place] [address]  Differential     Status: None   Collection Time: 09/24/22  6:09 AM  Result Value Ref Range   Neutrophils Relative % 56 %   Neutro Abs 3.8 1.7 - 7.7 K/uL   Lymphocytes Relative 34 %   Lymphs Abs 2.3 0.7 - 4.0 K/uL   Monocytes Relative 5 %   Monocytes Absolute 0.4 0.1 - 1.0 K/uL   Eosinophils Relative 4 %   Eosinophils Absolute 0.3 0.0 - 0.5 K/uL   Basophils Relative 1 %   Basophils Absolute 0.1 0.0 - 0.1 K/uL   Immature Granulocytes 0 %   Abs Immature Granulocytes 0.02 0.00 - 0.07 K/uL    Comment: Performed at [facility], [address] [place], [place] [address]  Comprehensive metabolic panel     Status: Abnormal   Collection Time: 09/24/22  6:09 AM  Result Value Ref Range   Sodium 138 135 - 145 mmol/L   Potassium 3.5 3.5 - 5.1 mmol/L   Chloride 104 98 - 111 mmol/L   CO2 24 22 - 32 mmol/L   Glucose, Bld 137 (H) 70 - 99 mg/dL    Comment: Glucose reference range applies only to samples taken after fasting for at least 8 hours.   BUN 32 (H) 6 - 20 mg/dL   Creatinine, Ser 6.44 0.44 - 1.00 mg/dL   Calcium 8.8 (L) 8.9 - 10.3 mg/dL   Total Protein 7.6 6.5 - 8.1 g/dL   Albumin 4.0 3.5 - 5.0 g/dL   AST 20 15 - 41 U/L   ALT 24 0 - 44 U/L   Alkaline Phosphatase 77 38 - 126 U/L   Total Bilirubin 0.5 0.3 - 1.2 mg/dL   GFR, Estimated >03 >47 mL/min    Comment: (NOTE) Calculated using the CKD-EPI Creatinine Equation (2021)    Anion gap 10 5 - 15    Comment: Performed at [facility], [address] [place], [place] [address]  Ethanol     Status: None   Collection Time: 09/24/22  6:10 AM   Result  Value Ref Range   Alcohol, Ethyl (B) <10 <10 mg/dL    Comment: (NOTE) Lowest detectable limit for serum alcohol is 10 mg/dL.  For medical purposes only. Performed at [facility], [address] [place], [place] [address]    MR Cervical Spine W and Wo Contrast  Result Date: 09/24/2022 CLINICAL DATA:  Left upper extremity and lower extremity numbness EXAM: MRI CERVICAL SPINE WITHOUT AND WITH CONTRAST TECHNIQUE: Multiplanar and multiecho pulse sequences of the cervical spine, to include the craniocervical junction and cervicothoracic junction, were obtained without and with intravenous contrast. CONTRAST:  10mL GADAVIST GADOBUTROL 1 MMOL/ML IV SOLN COMPARISON:  MRI cervical spine March 24, 2022 FINDINGS: Alignment: Straightening.  No substantial sagittal subluxation. Vertebrae: No fracture, evidence of discitis, or bone lesion. No abnormal enhancement. Cord: Normal cord signal.  No abnormal enhancement. Posterior Fossa, vertebral arteries, paraspinal tissues: Visualized vertebral artery flow voids are maintained. Disc levels: C2-C3: No significant disc protrusion, foraminal stenosis, or canal stenosis. C3-C4: No significant disc protrusion, foraminal stenosis, or canal stenosis. C4-C5: No significant disc protrusion, foraminal stenosis, or canal stenosis. C5-C6: No significant disc protrusion, foraminal stenosis, or canal stenosis. C6-C7: Similar mild to moderate left foraminal stenosis due to left foraminal disc protrusion. Patent canal and right foramen. C7-T1: No significant disc protrusion, foraminal stenosis, or canal stenosis. IMPRESSION: Similar mild to moderate left foraminal stenosis C6-C7 due to left foraminal disc protrusion. Electronically Signed   By: Feliberto Harts M.D.   On: 09/24/2022 10:45   MR Brain W and Wo Contrast  Result Date: 09/24/2022 CLINICAL DATA:  Left upper and lower extremity numbness. Some difficulty walking. Abnormal CT exam. EXAM: MRI HEAD  WITHOUT AND WITH CONTRAST TECHNIQUE: Multiplanar, multiecho pulse sequences of the brain and surrounding structures were obtained without and with intravenous contrast. CONTRAST:  10mL GADAVIST GADOBUTROL 1 MMOL/ML IV SOLN COMPARISON:  CT studies earlier same day.  MRI 10/05/2008. FINDINGS: Brain: Diffusion imaging shows a punctate acute infarction in the lateral right thalamus, quite likely responsible for the presenting symptoms. No abnormality affects the brainstem or cerebellum otherwise. Cerebral hemispheres show a few scattered foci of T2 and FLAIR signal within the white matter consistent with minimal small vessel change, progressive since 2010. Small hyperdensity seen in the right centrum semiovale region shows susceptibility artifact with mild surrounding T2 and FLAIR signal. In 2010, there was a tiny T2 abnormality in this region. 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 I cannot completely rule out some acute hemorrhage or thrombotic change in this region presently, but think that the clinical presentation could be explained entirely by the right thalamic infarction, and a second acute abnormality would be quite coincidental. No cortical abnormality. No hydrocephalus or extra-axial collection. Vascular: Major vessels at the base of the brain show flow. Skull and upper cervical spine: Negative Sinuses/Orbits: Clear/normal Other: None IMPRESSION: 1. 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 2. Minimal small-vessel change of the cerebral hemispheric white matter, progressive since 2010. 3. Small hyperdensity seen in the right centrum semiovale region by CT shows susceptibility artifact with mild surrounding T2 and FLAIR signal. In 2010, there was a tiny T2 abnormality in this region. 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 I cannot completely rule out  some acute hemorrhage or thrombotic change in this region presently, but think that the clinical presentation could be explained entirely by the right thalamic infarction, and a second acute abnormality would be quite coincidental. Electronically Signed   By: Paulina Fusi M.D.   On: 09/24/2022 10:37   DG Chest Portable 1 View  Result Date: 09/24/2022 CLINICAL DATA:  Stroke workup EXAM: PORTABLE CHEST 1 VIEW COMPARISON:  12/22/2021 FINDINGS: Artifact from EKG leads. Normal heart size and mediastinal contours for technique. No acute infiltrate or edema. No effusion or pneumothorax. No acute osseous findings. IMPRESSION: No active disease. Electronically Signed   By: Tiburcio Pea M.D.   On: 09/24/2022 07:32   CT ANGIO HEAD NECK W WO CM  Result Date: 09/24/2022 CLINICAL DATA:  Left arm numbness beginning 10 hours ago now with left arm and leg numbness beginning 2 hours ago. EXAM: CT ANGIOGRAPHY HEAD AND NECK WITH AND WITHOUT CONTRAST TECHNIQUE: Multidetector CT imaging of the head and neck was performed using the standard protocol during bolus administration of intravenous contrast. Multiplanar CT image reconstructions and MIPs were obtained to evaluate the vascular anatomy. Carotid stenosis measurements (when applicable) are obtained utilizing NASCET criteria, using the distal internal carotid diameter as the denominator. RADIATION DOSE REDUCTION: This exam was performed according to the departmental dose-optimization program which includes automated exposure control, adjustment of the mA and/or kV according to patient size and/or use of iterative reconstruction technique. CONTRAST:  75mL OMNIPAQUE IOHEXOL 350 MG/ML SOLN COMPARISON:  None Available. FINDINGS: CT HEAD FINDINGS Brain: A 4 mm high-density areas subtly seen in the right centrum semiovale, most convincing on reformats. No  evidence of gray matter infarct, hydrocephalus, or collection. Vascular: No hyperdense vessel or unexpected calcification. Skull: Normal. Negative for fracture or focal lesion. Sinuses/Orbits: No acute finding. Review of the MIP images confirms the above findings CTA NECK FINDINGS Aortic arch: 3 vessel branching. Right carotid system: Vessels are smoothly contoured and widely patent. No atheromatous changes noted peer Left carotid system: Vessels are smoothly contoured and widely patent. Vertebral arteries: The subclavian and vertebral arteries are smoothly contoured and widely patent. No branch occlusion, beading, or aneurysm. Skeleton: No acute or aggressive finding Other neck: Negative Upper chest: Clear apical lungs Review of the MIP images confirms the above findings CTA HEAD FINDINGS Anterior circulation: No significant stenosis, proximal occlusion, aneurysm, or vascular malformation. Posterior circulation: No significant stenosis, proximal occlusion, aneurysm, or vascular malformation. Venous sinuses: Diffusely patent Anatomic variants: None significant Review of the MIP images confirms the above findings IMPRESSION: 1. 4 mm high-density area in the right centrum semiovale small bleed, calcification, or cavernoma could give this appearance. Especially given the symptoms, recommend brain MRI with contrast. 2. Negative CTA of the head and neck. Electronically Signed   By: Tiburcio Pea M.D.   On: 09/24/2022 07:12    Pending Labs Unresulted Labs (From admission, onward)     Start     Ordered   09/25/22 0500  Basic metabolic panel  Tomorrow morning,   R        09/24/22 1243   09/25/22 0500  CBC  Tomorrow morning,   R        09/24/22 1243   09/25/22 0500  HIV Antibody (routine testing w rflx)  (HIV Antibody (Routine testing w reflex) panel)  Tomorrow morning,   R        09/24/22 1313            Vitals/Pain Today's Vitals  09/24/22 0554 09/24/22 0555 09/24/22 0723 09/24/22 1103  BP: (!)  169/95   (!) 168/90  Pulse: 71   74  Resp: 18   16  Temp: 98.2 F (36.8 C)   98.2 F (36.8 C)  TempSrc:    Oral  SpO2: 100%   100%  Weight:  95.3 kg    Height:  5\' 4"  (1.626 m)    PainSc:  0-No pain 0-No pain     Isolation Precautions No active isolations  Medications Medications  aspirin EC tablet 81 mg (81 mg Oral Given 09/24/22 1154)  enoxaparin (LOVENOX) injection 40 mg (40 mg Subcutaneous Given 09/24/22 1413)  acetaminophen (TYLENOL) tablet 650 mg (has no administration in time range)    Or  acetaminophen (TYLENOL) suppository 650 mg (has no administration in time range)  atorvastatin (LIPITOR) tablet 40 mg (40 mg Oral Given 09/24/22 1413)  linaclotide (LINZESS) capsule 290 mcg (has no administration in time range)  iohexol (OMNIPAQUE) 350 MG/ML injection 75 mL (75 mLs Intravenous Contrast Given 09/24/22 0648)  LORazepam (ATIVAN) tablet 1 mg (1 mg Oral Given 09/24/22 0912)  gadobutrol (GADAVIST) 1 MMOL/ML injection 10 mL (10 mLs Intravenous Contrast Given 09/24/22 1023)    Mobility walks     Focused Assessments Neuro Assessment Handoff:  Swallow screen pass? Yes  Cardiac Rhythm: Normal sinus rhythm NIH Stroke Scale  Dizziness Present: No Headache Present: Yes Interval: Shift assessment Level of Consciousness (1a.)   : Alert, keenly responsive LOC Questions (1b. )   : Answers both questions correctly LOC Commands (1c. )   : Performs both tasks correctly Best Gaze (2. )  : Normal Visual (3. )  : No visual loss Facial Palsy (4. )    : Normal symmetrical movements Motor Arm, Left (5a. )   : No drift Motor Arm, Right (5b. ) : No drift Motor Leg, Left (6a. )  : No drift Motor Leg, Right (6b. ) : No drift Limb Ataxia (7. ): Absent Sensory (8. )  : Mild-to-moderate sensory loss, patient feels pinprick is less sharp or is dull on the affected side, or there is a loss of superficial pain with pinprick, but patient is aware of being touched Best Language (9. )  : No  aphasia Dysarthria (10. ): Normal Extinction/Inattention (11.)   : No Abnormality Complete NIHSS TOTAL: 1     Neuro Assessment: Exceptions to WDL Neuro Checks:   Shift assessment (09/24/22 0723)  Has TPA been given? No If patient is a Neuro Trauma and patient is going to OR before floor call report to 4N Charge nurse: [phone] or [phone]   R Recommendations: See Admitting Provider Note  Report given to:   Additional Notes:

## 2022-09-24 NOTE — ED Triage Notes (Signed)
Arrives POV with significant other.   Reports left arm numbness beginning ~8PM last night. Went to bed around 11PM. Woke up at 4AM with left arm, and left leg numbness.   Checked bp and was 177/104. Has ongoing headaches.   Says she was seen in the past with similar sx and was diagnosed with a cerebral cavernoma.

## 2022-09-24 NOTE — ED Provider Notes (Signed)
Care of patient received from prior provider at 8:55 AM, please see their note for complete H/P and care plan.  Received handoff per ED course.  Clinical Course as of 09/24/22 1213  Wed Sep 24, 2022  1610 Assumed care from Dr Read Drivers. 52 yo F with hx of HTN and cervical radiculopathy who went to bed at 8pm with L upper numbness at at 0430 today also had LLE numbness no motor deficits. However, husband noted that she was having some difficulty walking.  Possible facial involvement. 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 Also has been having headaches recently.  On my repeat exam has no focal weakness.  Has decreased sensation to light touch and pinprick on the left upper extremity and left lower extremity but intact sensation in the face. [RP]  [id number] Dr Selina Cooley from neurology recommends MRI brain and c-spine WWO contrast.  [RP]  [phone] Dr Coralee Pesa from [facility] accepts the patient for ED to ED transfer to facilitate her MRI. [RP]  [phone] Patient sent via POV to [facility]. [RP]  1048 MR Brain W and Wo Contrast [CC]    Clinical Course User Index [CC] Glyn Ade, MD [RP] Rondel Baton, MD    Reassessment: Reevaluated patient immediately on arrival in the emergency room.  Given severity of symptoms and persistence of symptoms, cervical neck injury is considered more likely initially.  Patient was taken to MRI which was coordinated as soon as possible. MRI resulted with acute stroke.  Neurology was consulted emergently and recommended medical admission for risk stratification management and initiation of rehabilitation.  CRITICAL CARE Performed by: Glyn Ade  ?  Total critical care time: 45 minutes for CVA requiring transfer/admission  Critical care time was exclusive of separately billable procedures and treating other patients.  Critical care was necessary to treat or prevent imminent or life-threatening  deterioration.  Critical care was time spent personally by me on the following activities: development of treatment plan with patient and/or surrogate as well as nursing, discussions with consultants, evaluation of patient's response to treatment, examination of patient, obtaining history from patient or surrogate, ordering and performing treatments and interventions, ordering and review of laboratory studies, ordering and review of radiographic studies, pulse oximetry and re-evaluation of patient's condition.      Glyn Ade, MD 09/24/22 1214

## 2022-09-24 NOTE — ED Notes (Signed)
Patient transported to MRI 

## 2022-09-25 ENCOUNTER — Observation Stay (HOSPITAL_COMMUNITY): Payer: PRIVATE HEALTH INSURANCE

## 2022-09-25 DIAGNOSIS — I639 Cerebral infarction, unspecified: Secondary | ICD-10-CM

## 2022-09-25 DIAGNOSIS — I6389 Other cerebral infarction: Secondary | ICD-10-CM | POA: Diagnosis not present

## 2022-09-25 LAB — HEMOGLOBIN A1C
Hgb A1c MFr Bld: 5.9 % — ABNORMAL HIGH (ref 4.8–5.6)
Mean Plasma Glucose: 122.63 mg/dL

## 2022-09-25 LAB — CBC
HCT: 37.8 % (ref 36.0–46.0)
Hemoglobin: 13 g/dL (ref 12.0–15.0)
MCH: 30.4 pg (ref 26.0–34.0)
MCHC: 34.4 g/dL (ref 30.0–36.0)
MCV: 88.3 fL (ref 80.0–100.0)
Platelets: 266 10*3/uL (ref 150–400)
RBC: 4.28 MIL/uL (ref 3.87–5.11)
RDW: 13.2 % (ref 11.5–15.5)
WBC: 7.5 10*3/uL (ref 4.0–10.5)
nRBC: 0 % (ref 0.0–0.2)

## 2022-09-25 LAB — BASIC METABOLIC PANEL
Anion gap: 6 (ref 5–15)
BUN: 25 mg/dL — ABNORMAL HIGH (ref 6–20)
CO2: 25 mmol/L (ref 22–32)
Calcium: 8.8 mg/dL — ABNORMAL LOW (ref 8.9–10.3)
Chloride: 107 mmol/L (ref 98–111)
Creatinine, Ser: 0.99 mg/dL (ref 0.44–1.00)
GFR, Estimated: >60 mL/min (ref >60)
Glucose, Bld: 119 mg/dL — ABNORMAL HIGH (ref 70–99)
Potassium: 3.7 mmol/L (ref 3.5–5.1)
Sodium: 138 mmol/L (ref 135–145)

## 2022-09-25 LAB — ECHOCARDIOGRAM COMPLETE BUBBLE STUDY
AR max vel: 1.81 cm2
AV Peak grad: 6.2 mmHg
Ao pk vel: 1.24 m/s
Area-P 1/2: 4.21 cm2
S' Lateral: 3.1 cm

## 2022-09-25 LAB — HIV ANTIBODY (ROUTINE TESTING W REFLEX): HIV Screen 4th Generation wRfx: NONREACTIVE

## 2022-09-25 MED ORDER — STROKE: EARLY STAGES OF RECOVERY BOOK
Freq: Once | Status: DC
  Filled 2022-09-25: qty 1

## 2022-09-25 MED ORDER — CLOPIDOGREL BISULFATE 75 MG PO TABS
75.0000 mg | ORAL_TABLET | Freq: Every day | ORAL | Status: DC
  Administered 2022-09-25: 75 mg via ORAL
  Filled 2022-09-25: qty 1

## 2022-09-25 MED ORDER — ORAL CARE MOUTH RINSE: 15.0000 mL | OROMUCOSAL | Status: DC | PRN

## 2022-09-25 MED ORDER — ASPIRIN 81 MG PO TBEC: 81.0000 mg | DELAYED_RELEASE_TABLET | Freq: Every day | ORAL | 12 refills | Status: AC

## 2022-09-25 MED ORDER — CLOPIDOGREL BISULFATE 75 MG PO TABS: 75.0000 mg | ORAL_TABLET | Freq: Every day | ORAL | 0 refills | Status: AC

## 2022-09-25 MED ORDER — ATORVASTATIN CALCIUM 40 MG PO TABS: 40.0000 mg | ORAL_TABLET | Freq: Every day | ORAL | 2 refills | Status: DC

## 2022-09-25 NOTE — Evaluation (Signed)
Physical Therapy Evaluation Patient Details Name: Samantha Castro MRN: [id number] DOB: 05-11-1971 Today's Date: 09/25/2022  History of Present Illness  Patient is 52 y.o. female who presents to the emergency room for left upper and lower extremity numbness. 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 PMH significant for HTN, GERD, cerebral venous malformation, obesity, HLD, and OSA on CPAP.   Clinical Impression  Samantha Castro is 52 y.o. female admitted with above HPI and diagnosis. Patient is currently limited by functional impairments below (see PT problem list). Patient lives spouse and is independent and working full time as an Charity fundraiser at baseline. Patient evaluated by Physical Therapy with no further acute PT needs identified. All education has been completed and the patient has no further questions. She is mobilizing at [employer] Ind to Ind level with all mobility and scored a 23/24 on the DGI indicating no-low fall risk with dynamic mobility. Discussed lifestyle modifications to reduce risk of Stroke and S&S to be aware of and importance of seeking medical care if any symptoms present. See below for any follow-up Physical Therapy or equipment needs. PT is signing off. Thank you for this referral.        Recommendations for follow up therapy are one component of a multi-disciplinary discharge planning process, led by the attending physician.  Recommendations may be updated based on patient status, additional functional criteria and insurance authorization.  Follow Up Recommendations       Assistance Recommended at Discharge PRN  Patient can return home with the following       Equipment Recommendations None recommended by PT  Recommendations for Other Services       Functional Status Assessment Patient has had a recent decline in their functional status and demonstrates the ability to make significant improvements in function in a  reasonable and predictable amount of time.     Precautions / Restrictions Precautions Precautions: Fall Restrictions Weight Bearing Restrictions: No      Mobility  Bed Mobility Overal bed mobility: Independent             General bed mobility comments: use of bed features, HOB slightly elevated    Transfers Overall transfer level: Independent Equipment used: Rolling walker (2 wheels) Transfers: Sit to/from Stand Sit to Stand: Independent           General transfer comment: use of hands intermittently. not needed for power up or controlled lower.    Ambulation/Gait Ambulation/Gait assistance: Independent Gait Distance (Feet): 350 Feet Assistive device: None Gait Pattern/deviations: WFL(Within Functional Limits) Gait velocity: fair     General Gait Details: no LOB, gait steady, symmetric, and smooth  Stairs            Wheelchair Mobility    Modified Rankin (Stroke Patients Only)       Balance Overall balance assessment: Independent                               Standardized Balance Assessment Standardized Balance Assessment : Dynamic Gait Index   Dynamic Gait Index Level Surface: Normal Change in Gait Speed: Normal Gait with Horizontal Head Turns: Normal Gait with Vertical Head Turns: Normal Gait and Pivot Turn: Normal Step Over Obstacle: Normal Step Around Obstacles: Normal Steps: Mild Impairment Total Score: 23       Pertinent Vitals/Pain Pain Assessment Pain Assessment: No/denies pain    Home Living Family/patient  expects to be discharged to:: Private residence Living Arrangements: Spouse/significant other;Children Available Help at Discharge: Family Type of Home: House Home Access: Stairs to enter Entrance Stairs-Rails: Right Entrance Stairs-Number of Steps: 2   Home Layout: One level Home Equipment: None      Prior Function Prior Level of Function : Independent/Modified Independent              Mobility Comments: independent and working full time as an Charity fundraiser at [employer]       Hand Dominance   Dominant Hand: Right    Extremity/Trunk Assessment   Upper Extremity Assessment Upper Extremity Assessment: Overall WFL for tasks assessed    Lower Extremity Assessment Lower Extremity Assessment: Overall WFL for tasks assessed;RLE deficits/detail;LLE deficits/detail RLE Deficits / Details: 4+/5 or better throughout RLE Sensation: WNL RLE Coordination: WNL LLE Deficits / Details: 4+/5 or better throughout LLE Sensation: decreased light touch LLE Coordination: WNL    Cervical / Trunk Assessment Cervical / Trunk Assessment: Normal  Communication   Communication: No difficulties  Cognition Arousal/Alertness: Awake/alert Behavior During Therapy: WFL for tasks assessed/performed Overall Cognitive Status: Within Functional Limits for tasks assessed                                          General Comments      Exercises     Assessment/Plan    PT Assessment Patient does not need any further PT services  PT Problem List         PT Treatment Interventions      PT Goals (Current goals can be found in the Care Plan section)  Acute Rehab PT Goals Patient Stated Goal: get home and on vacation PT Goal Formulation: With patient/family Time For Goal Achievement: 10/09/22 Potential to Achieve Goals: Good    Frequency       Co-evaluation               AM-PAC PT "6 Clicks" Mobility  Outcome Measure Help needed turning from your back to your side while in a flat bed without using bedrails?: None Help needed moving from lying on your back to sitting on the side of a flat bed without using bedrails?: None Help needed moving to and from a bed to a chair (including a wheelchair)?: None Help needed standing up from a chair using your arms (e.g., wheelchair or bedside chair)?: None Help needed to walk in hospital room?: None Help needed climbing 3-5 steps  with a railing? : None 6 Click Score: 24    End of Session Equipment Utilized During Treatment: Gait belt Activity Tolerance: Patient tolerated treatment well Patient left: in bed;with call bell/phone within reach;with family/visitor present Nurse Communication: Mobility status PT Visit Diagnosis: Other abnormalities of gait and mobility (R26.89);Other symptoms and signs involving the nervous system (F62.130)    Time: 8657-8469 PT Time Calculation (min) (ACUTE ONLY): 24 min   Charges:   PT Evaluation $PT Eval Low Complexity: 1 Low PT Treatments $Gait Training: 8-22 mins        Wynn Maudlin, DPT Acute Rehabilitation Services Office [phone]  09/25/22 10:07 AM

## 2022-09-25 NOTE — Plan of Care (Signed)

## 2022-09-25 NOTE — Discharge Summary (Signed)
Name: Samantha Castro MRN: [id number] DOB: [birth year] 52 y.o. PCP: Deloris Ping, MD  Date of Admission: 09/24/2022  5:45 AM Date of Discharge: No discharge date for patient encounter. Attending Physician: Tyson Alias, *  Discharge Diagnosis: 1. Principal Problem:   Acute cerebral infarction Active Problems:   Essential hypertension   Cerebral cavernoma   OSA (obstructive sleep apnea)   Discharge Medications: Allergies as of 09/25/2022       Reactions   Bacitracin-polymyxin B Rash   Vancomycin Hives, Rash   Lisinopril Other (See Comments), Cough   Unknown Other Reaction(s): cough, Other (See Comments) Cough  Unknown Cough Cough, Unknown   Other Rash   PAPER/SILK TAPE OK   Tape Rash   PAPER/SILK TAPE OK   Wound Dressing Adhesive Rash   PAPER/SILK TAPE OK        Medication List     TAKE these medications    amLODipine 5 MG tablet Commonly known as: NORVASC Take by mouth.   aspirin EC 81 MG tablet Take 1 tablet (81 mg total) by mouth daily. Swallow whole. Start taking on: September 26, 2022   atorvastatin 40 MG tablet Commonly known as: LIPITOR Take 1 tablet (40 mg total) by mouth daily. Start taking on: September 26, 2022   Biotin 10 MG Caps Take by mouth.   clopidogrel 75 MG tablet Commonly known as: PLAVIX Take 1 tablet (75 mg total) by mouth daily for 21 days. Start taking on: September 26, 2022   diclofenac 50 MG EC tablet Commonly known as: VOLTAREN Take 50 mg by mouth 2 (two) times daily as needed.   gabapentin 100 MG capsule Commonly known as: NEURONTIN Take by mouth.   hydrOXYzine 10 MG tablet Commonly known as: ATARAX Take 10 mg by mouth continuous as needed for itching or anxiety (Use as needed for flare).   linaclotide 290 MCG Caps capsule Commonly known as: LINZESS Take 1 capsule (290 mcg total) by mouth daily before breakfast.   Misc. Devices Misc Initiate CPAP therapy at 10 cm. water pressure with EPR 3.  CPAP  machine, mask and supplies for OSA.  F&P Eson II nasal mask or patient preference.  Send to [place].   OVER THE COUNTER MEDICATION Turbo Power Vitamin/Super Immunity   oxybutynin 5 MG tablet Commonly known as: DITROPAN Take 5 mg by mouth daily as needed for bladder spasms.   pantoprazole 40 MG tablet Commonly known as: PROTONIX Take 40 mg by mouth daily.   Uribel 118 MG Caps Take 118 mg by mouth continuous as needed (Only as needed for flare).   valsartan 160 MG tablet Commonly known as: DIOVAN Take by mouth.        Disposition and follow-up:   Samantha Castro was discharged from [facility] in Stable condition.  At the hospital follow up visit please address:  1.    Acute Right thalamic infarct -3 weeks of ASA and Plavix, then .Marland Kitchen Alone -Start Lipitor 40 mg. Goal LDL < 70  Chronic venous anomaly -Follow up with Neurology outpatient   2.  Labs / imaging needed at time of follow-up: NA  3.  Pending labs/ test needing follow-up: NA  Follow-up Appointments:  Follow-up Information     [facility]. Schedule an appointment as soon as possible for a visit in 1 month(s).   Why: Please call to make an appointment in 2 months. Contact information: [address]     [address]  [place] [address] [phone]                Hospital Course by problem list: Shakiya Mcneary is a 52 y/o person living with hypertension, sleep apnea, hyperlipidemia who presented to the hospital with left arm and leg paresthesias, admitted to our service for management of an acute right thalamic infarction.  Acute Right Thalamic Infarct Patient was in usual state of health until 8 pm on 4/23, when she developed left arm numbness. Patient attributed this to radiculopathy, went to bed, and woke up at 4 AM on 4/24 with left upper and left lower extremity numbness. She had issue with ambulation but no arm or leg weakness at that time.  No dysphagia, speech difficulty, facial droop, or vision changes noted in ED. Patient was hypertensive at 169/95 and CBC, CMP were largely unremarkable. MRI brain w/wo contrast was performed and demonstrated punctate acute ischemic infarction of the right lateral thalamus as well as chronic venous abnormality of right centrum semiovale. Findings of infarct involving right lateral thalamus are consistent with patient's pure sensory deficits on examination. Neurology consulted and patient's home anti-HTN regimen was held for permissive hypertension. Patient also deemed outside of TPA window.   Work-up (CT angiography, telemetry, cardiac echo w/ bubble study) was negative for embolic source or large vessel occlusion. Given work-up and patient's risk factors (hypertension, OSA, hyperlipidemia), etiology of infarct presumed to be thrombotic and secondary prevention with atorvastatin, aspirin, and clopidogrel was initiated. She will be on aspirin and plavis for 3 weeks then aspirin alone. Prior to discharge, patient's symptoms of left upper and left lower extremity paresthesia improved. She did not require any PT/OT follow up needs.   Hypertension Patient home anti-hypertension regimen was held for 24 hours upon admission to facilitate permissive hypertension.  - Continue home amlodipine and valsartan    OSA - Resume CPAP nightly   Constipation History of colectomy Patient reported history of colectomy due to an endometrial mass.   - Resume home Linzess  Pertinent Labs, Studies, and Procedures:     Latest Ref Rng & Units 09/25/2022    4:47 AM 09/24/2022    6:09 AM 12/22/2021    8:00 PM  CBC  WBC 4.0 - 10.5 K/uL 7.5  6.8  8.7   Hemoglobin 12.0 - 15.0 g/dL 65.7  84.6  96.2   Hematocrit 36.0 - 46.0 % 37.8  38.5  42.4   Platelets 150 - 400 K/uL 266  268  275       Latest Ref Rng & Units 09/25/2022    4:47 AM 09/24/2022    6:09 AM 12/22/2021   10:04 PM  CMP  Glucose 70 - 99 mg/dL 952  841    BUN 6  - 20 mg/dL 25  32    Creatinine 3.24 - 1.00 mg/dL 4.01  0.27    Sodium 253 - 145 mmol/L 138  138    Potassium 3.5 - 5.1 mmol/L 3.7  3.5    Chloride 98 - 111 mmol/L 107  104    CO2 22 - 32 mmol/L 25  24    Calcium 8.9 - 10.3 mg/dL 8.8  8.8    Total Protein 6.5 - 8.1 g/dL  7.6  6.7   Total Bilirubin 0.3 - 1.2 mg/dL  0.5  0.6   Alkaline Phos 38 - 126 U/L  77  109   AST 15 - 41 U/L  20  160   ALT 0 - 44 U/L  24  85    ECHOCARDIOGRAM COMPLETE BUBBLE STUDY  Result Date: 09/25/2022    ECHOCARDIOGRAM REPORT   Patient Name:   Areta Terwilliger Date of Exam: 09/25/2022 Medical Rec #:  [id number]           Height:       64.0 in Accession #:    [id number]          Weight:       210.0 lb Date of Birth:  May 16, 1971           BSA:          1.997 m Patient Age:    52 years            BP:           138/85 mmHg Patient Gender: F                   HR:           58 bpm. Exam Location:  Inpatient Procedure: 2D Echo, Cardiac Doppler, Color Doppler and Saline Contrast Bubble            Study Indications:    Stroke 434.91/I63.9  History:        Patient has no prior history of Echocardiogram examinations.                 Risk Factors:Hypertension and Sleep Apnea.  Sonographer:    Lucendia Herrlich Referring Phys: Erick Blinks IMPRESSIONS  1. Left ventricular ejection fraction, by estimation, is 60 to 65%. The left ventricle has normal function. The left ventricle has no regional wall motion abnormalities. Left ventricular diastolic parameters were normal.  2. Right ventricular systolic function is normal. The right ventricular size is normal. Tricuspid regurgitation signal is inadequate for assessing PA pressure.  3. The mitral valve is normal in structure. Trivial mitral valve regurgitation. No evidence of mitral stenosis.  4. The aortic valve is normal in structure. Aortic valve regurgitation is not visualized. No aortic stenosis is present.  5. The inferior vena cava is normal in size with greater than 50% respiratory  variability, suggesting right atrial pressure of 3 mmHg.  6. Agitated saline contrast bubble study was negative, with no evidence of any interatrial shunt. Conclusion(s)/Recommendation(s): No intracardiac source of embolism detected on this transthoracic study. Consider a transesophageal echocardiogram to exclude cardiac source of embolism if clinically indicated. FINDINGS  Left Ventricle: Left ventricular ejection fraction, by estimation, is 60 to 65%. The left ventricle has normal function. The left ventricle has no regional wall motion abnormalities. The left ventricular internal cavity size was normal in size. There is  no left ventricular hypertrophy. Left ventricular diastolic parameters were normal. Normal left ventricular filling pressure. Right Ventricle: The right ventricular size is normal. No increase in right ventricular wall thickness. Right ventricular systolic function is normal. Tricuspid regurgitation signal is inadequate for assessing PA pressure. Left Atrium: Left atrial size was normal in size. Right Atrium: Right atrial size was normal in size. Pericardium: There is no evidence of pericardial effusion. Mitral Valve: The mitral valve is normal in structure. Mild mitral annular calcification. Trivial mitral valve regurgitation. No evidence of mitral valve stenosis. Tricuspid Valve: The tricuspid valve is normal in structure. Tricuspid valve regurgitation is trivial. No evidence of tricuspid stenosis. Aortic Valve: The aortic valve is normal in structure. Aortic valve regurgitation is not visualized. No aortic stenosis is present. Aortic valve peak gradient measures 6.2 mmHg. Pulmonic Valve: The pulmonic valve was normal  in structure. Pulmonic valve regurgitation is trivial. No evidence of pulmonic stenosis. Aorta: The aortic root is normal in size and structure. Venous: The inferior vena cava is normal in size with greater than 50% respiratory variability, suggesting right atrial pressure of 3  mmHg. IAS/Shunts: No atrial level shunt detected by color flow Doppler. Agitated saline contrast was given intravenously to evaluate for intracardiac shunting. Agitated saline contrast bubble study was negative, with no evidence of any interatrial shunt.  LEFT VENTRICLE PLAX 2D LVIDd:         4.30 cm   Diastology LVIDs:         3.10 cm   LV e' medial:    12.00 cm/s LV PW:         1.20 cm   LV E/e' medial:  5.2 LV IVS:        1.00 cm   LV e' lateral:   9.03 cm/s LVOT diam:     1.90 cm   LV E/e' lateral: 6.9 LV SV:         51 LV SV Index:   26 LVOT Area:     2.84 cm  RIGHT VENTRICLE             IVC RV S prime:     13.90 cm/s  IVC diam: 1.60 cm TAPSE (M-mode): 1.8 cm LEFT ATRIUM             Index        RIGHT ATRIUM          Index LA diam:        4.10 cm 2.05 cm/m   RA Area:     8.34 cm LA Vol (A2C):   50.1 ml 25.08 ml/m  RA Volume:   12.50 ml 6.26 ml/m LA Vol (A4C):   49.8 ml 24.93 ml/m LA Biplane Vol: 54.9 ml 27.49 ml/m  AORTIC VALVE AV Area (Vmax): 1.81 cm AV Vmax:        124.00 cm/s AV Peak Grad:   6.2 mmHg LVOT Vmax:      79.33 cm/s LVOT Vmean:     50.200 cm/s LVOT VTI:       0.180 m  AORTA Ao Root diam: 3.10 cm Ao Asc diam:  3.30 cm MITRAL VALVE MV Area (PHT): 4.21 cm    SHUNTS MV Decel Time: 180 msec    Systemic VTI:  0.18 m MV E velocity: 62.70 cm/s  Systemic Diam: 1.90 cm MV A velocity: 48.10 cm/s MV E/A ratio:  1.30 Armanda Magic MD Electronically signed by Armanda Magic MD Signature Date/Time: 09/25/2022/12:55:16 PM    Final    MR Cervical Spine W and Wo Contrast  Result Date: 09/24/2022 CLINICAL DATA:  Left upper extremity and lower extremity numbness EXAM: MRI CERVICAL SPINE WITHOUT AND WITH CONTRAST TECHNIQUE: Multiplanar and multiecho pulse sequences of the cervical spine, to include the craniocervical junction and cervicothoracic junction, were obtained without and with intravenous contrast. CONTRAST:  10mL GADAVIST GADOBUTROL 1 MMOL/ML IV SOLN COMPARISON:  MRI cervical spine March 24, 2022  FINDINGS: Alignment: Straightening.  No substantial sagittal subluxation. Vertebrae: No fracture, evidence of discitis, or bone lesion. No abnormal enhancement. Cord: Normal cord signal.  No abnormal enhancement. Posterior Fossa, vertebral arteries, paraspinal tissues: Visualized vertebral artery flow voids are maintained. Disc levels: C2-C3: No significant disc protrusion, foraminal stenosis, or canal stenosis. C3-C4: No significant disc protrusion, foraminal stenosis, or canal stenosis. C4-C5: No significant disc protrusion, foraminal stenosis, or canal stenosis. C5-C6: No  significant disc protrusion, foraminal stenosis, or canal stenosis. C6-C7: Similar mild to moderate left foraminal stenosis due to left foraminal disc protrusion. Patent canal and right foramen. C7-T1: No significant disc protrusion, foraminal stenosis, or canal stenosis. IMPRESSION: Similar mild to moderate left foraminal stenosis C6-C7 due to left foraminal disc protrusion. Electronically Signed   By: Feliberto Harts M.D.   On: 09/24/2022 10:45   MR Brain W and Wo Contrast  Result Date: 09/24/2022 CLINICAL DATA:  Left upper and lower extremity numbness. Some difficulty walking. Abnormal CT exam. EXAM: MRI HEAD WITHOUT AND WITH CONTRAST TECHNIQUE: Multiplanar, multiecho pulse sequences of the brain and surrounding structures were obtained without and with intravenous contrast. CONTRAST:  10mL GADAVIST GADOBUTROL 1 MMOL/ML IV SOLN COMPARISON:  CT studies earlier same day.  MRI 10/05/2008. FINDINGS: Brain: Diffusion imaging shows a punctate acute infarction in the lateral right thalamus, quite likely responsible for the presenting symptoms. No abnormality affects the brainstem or cerebellum otherwise. Cerebral hemispheres show a few scattered foci of T2 and FLAIR signal within the white matter consistent with minimal small vessel change, progressive since 2010. Small hyperdensity seen in the right centrum semiovale region shows  susceptibility artifact with mild surrounding T2 and FLAIR signal. In 2010, there was a tiny T2 abnormality in this region. 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 I cannot completely rule out some acute hemorrhage or thrombotic change in this region presently, but think that the clinical presentation could be explained entirely by the right thalamic infarction, and a second acute abnormality would be quite coincidental. No cortical abnormality. No hydrocephalus or extra-axial collection. Vascular: Major vessels at the base of the brain show flow. Skull and upper cervical spine: Negative Sinuses/Orbits: Clear/normal Other: None IMPRESSION: 1. 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 2. Minimal small-vessel change of the cerebral hemispheric white matter, progressive since 2010. 3. Small hyperdensity seen in the right centrum semiovale region by CT shows susceptibility artifact with mild surrounding T2 and FLAIR signal. In 2010, there was a tiny T2 abnormality in this region. 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 I cannot completely rule out some acute hemorrhage or thrombotic change in this region presently, but think that the clinical presentation could be explained entirely by the right thalamic infarction, and a second acute abnormality would be quite coincidental. Electronically Signed   By: Paulina Fusi M.D.   On: 09/24/2022 10:37   DG Chest Portable 1 View  Result Date: 09/24/2022 CLINICAL DATA:  Stroke workup EXAM: PORTABLE CHEST 1 VIEW COMPARISON:  12/22/2021 FINDINGS: Artifact from EKG leads. Normal heart size and mediastinal contours for technique. No acute infiltrate or edema. No effusion or pneumothorax. No acute  osseous findings. IMPRESSION: No active disease. Electronically Signed   By: Tiburcio Pea M.D.   On: 09/24/2022 07:32   CT ANGIO HEAD NECK W WO CM  Result Date: 09/24/2022 CLINICAL DATA:  Left arm numbness beginning 10 hours ago now with left arm and leg numbness beginning 2 hours ago. EXAM: CT ANGIOGRAPHY HEAD AND NECK WITH AND WITHOUT CONTRAST TECHNIQUE: Multidetector CT imaging of the head and neck was performed using the standard protocol during bolus administration of intravenous contrast. Multiplanar CT image reconstructions and MIPs were obtained to evaluate the vascular  anatomy. Carotid stenosis measurements (when applicable) are obtained utilizing NASCET criteria, using the distal internal carotid diameter as the denominator. RADIATION DOSE REDUCTION: This exam was performed according to the departmental dose-optimization program which includes automated exposure control, adjustment of the mA and/or kV according to patient size and/or use of iterative reconstruction technique. CONTRAST:  75mL OMNIPAQUE IOHEXOL 350 MG/ML SOLN COMPARISON:  None Available. FINDINGS: CT HEAD FINDINGS Brain: A 4 mm high-density areas subtly seen in the right centrum semiovale, most convincing on reformats. No evidence of gray matter infarct, hydrocephalus, or collection. Vascular: No hyperdense vessel or unexpected calcification. Skull: Normal. Negative for fracture or focal lesion. Sinuses/Orbits: No acute finding. Review of the MIP images confirms the above findings CTA NECK FINDINGS Aortic arch: 3 vessel branching. Right carotid system: Vessels are smoothly contoured and widely patent. No atheromatous changes noted peer Left carotid system: Vessels are smoothly contoured and widely patent. Vertebral arteries: The subclavian and vertebral arteries are smoothly contoured and widely patent. No branch occlusion, beading, or aneurysm. Skeleton: No acute or aggressive finding Other neck: Negative Upper chest: Clear apical  lungs Review of the MIP images confirms the above findings CTA HEAD FINDINGS Anterior circulation: No significant stenosis, proximal occlusion, aneurysm, or vascular malformation. Posterior circulation: No significant stenosis, proximal occlusion, aneurysm, or vascular malformation. Venous sinuses: Diffusely patent Anatomic variants: None significant Review of the MIP images confirms the above findings IMPRESSION: 1. 4 mm high-density area in the right centrum semiovale small bleed, calcification, or cavernoma could give this appearance. Especially given the symptoms, recommend brain MRI with contrast. 2. Negative CTA of the head and neck. Electronically Signed   By: Tiburcio Pea M.D.   On: 09/24/2022 07:12     Discharge Instructions: Discharge Instructions     Call MD for:  difficulty breathing, headache or visual disturbances   Complete by: As directed    Call MD for:  persistant nausea and vomiting   Complete by: As directed    Call MD for:  redness, tenderness, or signs of infection (pain, swelling, redness, odor or green/yellow discharge around incision site)   Complete by: As directed    Call MD for:  severe uncontrolled pain   Complete by: As directed    Call MD for:  temperature >100.4   Complete by: As directed    Diet - low sodium heart healthy   Complete by: As directed    Discharge instructions   Complete by: As directed    Ms. Reyburn,  It was a pleasure taking care of you during this admission. Your symptom of left arm and leg numbness is due to a stroke in the Right thalamic region. I am glad that you are doing well after this stroke.   Please start taking Aspirin 81 mg and Plavix 75 mg for 3 weeks then Aspirin alone. Please also take Atorvastatin 40 mg daily to help lower your cholesterol.   You can resume taking your blood pressure medications tomorrow.   Please follow up with your primary care doctor in 2-4 weeks and Neurology in 2 months. .   Take care,   Dr. Cyndie Chime    Increase activity slowly   Complete by: As directed        Signed: Doran Stabler, DO 09/25/2022, 2:09 PM   Pager: [phone]

## 2022-09-25 NOTE — Progress Notes (Signed)
OT Cancellation Note  Patient Details Name: Samantha Castro MRN: [id number] DOB: 11-06-1970   Cancelled Treatment:    Reason Eval/Treat Not Completed: OT screened, no needs identified, will sign off. Spoke to patient who reports she is at her baseline with ADLs, mobility and cognition.  Reports mild tingling in L hand, but this was present before admission and is not affecting her ADL performance.  Pt independent per SLP and PT.  No acute OT needs identified, OT will sign off.   Barry Brunner, OT Acute Rehabilitation Services Office [phone]  Samantha Castro 09/25/2022, 12:26 PM

## 2022-09-25 NOTE — Progress Notes (Signed)
Discharges instructions discussed with patient. All questions answered. Telemetry discontinued, IV removed. Patient to be wheeled off unit for transport home.  Melony Overly, RN

## 2022-09-25 NOTE — TOC CM/SW Note (Signed)
  Transition of Care [facility]) Screening Note   Patient Details  Name: Samantha Castro Date of Birth: 06/02/1971   Transition of Care [facility]) CM/SW Contact:    Kingsley Plan, RN Phone Number: 09/25/2022, 7:47 AM  Await PT/OT/SP recommendations. Has PCP listed   Transition of Care Department [facility]) has reviewed patient and no TOC needs have been identified at this time. We will continue to monitor patient advancement through interdisciplinary progression rounds. If new patient transition needs arise, please place a TOC consult.

## 2022-09-25 NOTE — Hospital Course (Addendum)
Acute Right Thalamic Infarct Samantha Castro is a 52 y/o person living with hypertension, sleep apnea, hyperlipidemia, and cerebral cavernoma who presented to the hospital with left arm and leg paresthesias. Patient was in usual state of health until 8 pm on 4/23, when she developed left arm numbness. Patient attributed this to radiculopathy, went to bed, and woke up at 4 AM on 4/24 with left upper and left lower extremity numbness. She had issues with ambulation but no arm or leg weakness at that time. No dysphagia, speech difficulty, facial droop, or vision changes noted in ED. Patient was hypertensive at 169/95 and CBC, CMP were largely unremarkable. MRI brain w/wo contrast was performed and demonstrated punctate acute ischemic infarction of the right lateral thalamus as well as chronic venous abnormality of right centrum semiovale. Findings of infarct involving right lateral thalamus are consistent with patient's pure sensory deficits on examination. Neurology consulted and patient's home anti-HTN regimen was held for permissive hypertension. Patient also deemed outside of TPA window. Work-up (CT angiography, telemetry, cardiac echo w/ bubble study) was negative for embolic source or large vessel occlusion. Given work-up and patient's risk factors (hypertension, OSA, hyperlipidemia), etiology of infarct presumed to be thrombotic and secondary prevention with atorvastatin, aspirin, and clopidogrel was initiated. Prior to discharge, patient's symptoms of left upper and left lower extremity numbness/tingling improved. She reported no numbness/tingling of left upper extremity but did continue to experience mild numbness/tingling of left lower extremity. Of note, PT/SLP also evaluated patient and signed off without recommendations.  - Start atorvastatin 40 mg daily for secondary prevention (long term)  - Start aspirin 81 mg for secondary prevention (long term)  - Start clopidogrel 81 mg for secondary prevention for  3 weeks   Hypertension Patient home anti-hypertension regimen was held for 24 hours upon admission to facilitate permissive hypertension.  - Continue home amlodipine and valsartan    OSA - Resume CPAP nightly   Constipation History of colectomy Patient reported history of colectomy due to an endometrial mass.   - Resume home Linzess

## 2022-09-25 NOTE — Progress Notes (Signed)
  Echocardiogram 2D Echocardiogram has been performed.  Lucendia Herrlich 09/25/2022, 12:01 PM

## 2022-09-25 NOTE — Evaluation (Signed)
Speech Language Pathology Evaluation Patient Details Name: Samantha Castro MRN: [id number] DOB: [birth year] Today's Date: 09/25/2022 Time: 9629-5284 SLP Time Calculation (min) (ACUTE ONLY): 16 min  Problem List:  Patient Active Problem List   Diagnosis Date Noted   Acute cerebral infarction 09/24/2022   Cerebral cavernoma 09/24/2022   OSA (obstructive sleep apnea) 09/24/2022   Prediabetes 03/21/2021   Essential hypertension 03/21/2021   Class 2 severe obesity with serious comorbidity and body mass index (BMI) of 37.0 to 37.9 in adult 03/21/2021   Genetic testing 03/12/2017   History of melanoma    Family history of breast cancer    Family history of colon cancer    Past Medical History:  Past Medical History:  Diagnosis Date   Allergic rhinitis    Anemia    Asthma    Back pain    Constipation    Endometriosis    GERD (gastroesophageal reflux disease)    History of kidney stones    History of melanoma    Hypertension    Interstitial cystitis    Lower extremity edema    PONV (postoperative nausea and vomiting)    Sleep apnea    Past Surgical History:  Past Surgical History:  Procedure Laterality Date   ABDOMINAL HYSTERECTOMY     APPENDECTOMY     ARTHROSCOPIC REPAIR ACL     COLON RESECTION     COLON SURGERY     EXCISION MELANOMA WITH SENTINEL LYMPH NODE BIOPSY Right 12/15/2016   Procedure: WIDE EXCISION RIGHT WRIST  MELANOMA WITH RIGHT SENTINEL NODE MAPPING;  Surgeon: Harriette Bouillon, MD;  Location: [facility];  Service: General;  Laterality: Right;   IRRIGATION AND DEBRIDEMENT ABSCESS Right 12/28/2016   Procedure: IRRIGATION AND DEBRIDEMENT AXILLARY ABSCESS;  Surgeon: Manus Rudd, MD;  Location: [facility] OR;  Service: General;  Laterality: Right;   MELANOMA EXCISION     PELVIC LAPAROSCOPY     HPI:  Pt is a 52 y.o. female who presents to the emergency room for left upper and lower extremity numbness. MRI brain (09/24/22)  showed an acute right thalamic  infarct. PMH: hypertension, cerebral venous malformation, obesity, hyperlipidemia, OSA on CPAP.   Assessment / Plan / Recommendation Clinical Impression  Pt presents with cognitive, speech and language skills that are [facility] s/p CVA. Pt and spouse, present for session, report she is at baseline. She is oriented x4 and able to recall details of recent conversations held with care team over past 24 hours. Verbal problem solving for completion/managment of IADLs also WFL. Speech and language fucntion are [facility] for tasks provided, including during informal conversation. No further SLP services recommended at this time. Will s/o.    SLP Assessment  SLP Recommendation/Assessment: Patient does not need any further Speech Lanaguage Pathology Services SLP Visit Diagnosis: Cognitive communication deficit (R41.841)    Recommendations for follow up therapy are one component of a multi-disciplinary discharge planning process, led by the attending physician.  Recommendations may be updated based on patient status, additional functional criteria and insurance authorization.    Follow Up Recommendations  No SLP follow up    Assistance Recommended at Discharge  PRN  Functional Status Assessment Patient has not had a recent decline in their functional status  Frequency and Duration           SLP Evaluation Cognition  Overall Cognitive Status: Within Functional Limits for tasks assessed Arousal/Alertness: Awake/alert Orientation Level: Oriented X4 Year: 2024 Month: April Day of Week: Correct Attention: Sustained Sustained  Attention: Appears intact Memory: Appears intact Awareness: Appears intact Problem Solving: Appears intact       Comprehension  Auditory Comprehension Overall Auditory Comprehension: Appears within functional limits for tasks assessed Visual Recognition/Discrimination Discrimination: Not tested Reading Comprehension Reading Status: Not tested    Expression Expression Primary  Mode of Expression: Verbal Verbal Expression Overall Verbal Expression: Appears within functional limits for tasks assessed Initiation: No impairment Automatic Speech: Name;Social Response Level of Generative/Spontaneous Verbalization: Music therapist Expression Written Expression: Not tested   Oral / Motor  Oral Motor/Sensory Function Overall Oral Motor/Sensory Function: Within functional limits Motor Speech Overall Motor Speech: Appears within functional limits for tasks assessed             Avie Echevaria, MA, CCC-SLP Acute Rehabilitation Services Office Number: [phone]  Paulette Blanch 09/25/2022, 9:21 AM

## 2022-09-25 NOTE — Progress Notes (Signed)
   Subjective:  Samantha Castro is a 52 y/o person living with hypertension, sleep apnea who presented to the hospital with left arm and leg paresthesias, admitted to our service for management of a right thalamic acute cerebral infarction   Currently, the patient is doing well. Numbness/tingling of left arm has resolved. Continues to experience mild numbness/tingling of left lower extremity. No other symptoms noted. Hoping to be discharged today and possibly go camping with family.   Interval Events: - None    Objective:  Vital Signs:   Temp:  97.9 F (36.6 C)  Pulse Rate:  [64-78] 64  Resp:  [16-17] 17  BP: (138-168)/(75-90) 138/85 SpO2:  [97 %-100 %] 97 %   Physical Exam: General: Well-appearing, alert, no acute distress  Extremities: Normal ROM, no edema  Neuro:  No focal deficits; 5/5 strength throughout; mild numbness/tingling of LLE   Skin Warm, dry      Labs:  Basic Metabolic Panel: Recent Labs  Lab 09/24/22 0609 09/25/22 0447  NA 138 138  K 3.5 3.7  CL 104 107  CO2 24 25  GLUCOSE 137* 119*  BUN 32* 25*  CREATININE 0.94 0.99  CALCIUM 8.8* 8.8*    CBC: Recent Labs  Lab 09/24/22 0609 09/25/22 0447  WBC 6.8 7.5  NEUTROABS 3.8  --   HGB 13.2 13.0  HCT 38.5 37.8  MCV 87.9 88.3  PLT 268 266   Imaging:  ECHO bubble study: pending    Assessment & Plan:  Samantha Castro is a 52 y/o person living with hypertension, sleep apnea, hyperlipidemia who presented to the hospital with left arm and leg paresthesias, admitted to our service for management of a right thalamic acute cerebral infarction.  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 NIHS stroke scale score of 1, indicating a good prognosis. Patient's infarction is most likely thrombotic  in origin. Risk factors for thrombosis include hypertension, OSA, hyperlipidemia. Work-up thus far negative for embolic source/large vessel occlusion. Cardiac echo w/ bubble study is pending.  - Neurology consulted, appreciate recommendations  - Follow-up cardiac echo w/ bubble study  - Continue atorvastatin 40 mg daily for secondary prevention (long-term) - Continue aspirin 81 mg for secondary prevention (long-term) - Continue clopidogrel 75 mg daily for secondary prevention (3 week course)  - Continue Lovenox 40 mg SQ daily for DVT ppx - Hold home anti-hypertensive until this afternoon for permissive HTN (goal<220/110) - D/c telemetry  - PT/SLP signed off (no recommendations)  - Stroke education booklet   Hypertension - Holding home amlodipine and valsartan until this afternoon    OSA -Resume CPAP nightly   Constipation History of colectomy Patient reported history of colectomy due to an endometrial mass.   - Resume home Linzess  DVT PPX -  Lovenox CODE STATUS - Full CONSULTS PLACED - Neurology  DISPO - Disposition is deferred at this time, awaiting cardiac echo results.  Anticipated discharge in approximately 1 day(s).  The patient does have a current PCP (Ryter-Brown, Fritzi Mandes, MD) and does need an [facility] hospital follow-up appointment after discharge.   Is the [facility] hospital follow-up appointment a one-time only appointment? not applicable. Does the patient have transportation limitations that hinder transportation to clinic appointments? no  Length of Stay: 0 day(s)   Signed:  Lyda Kalata, Medical Student 09/25/2022, 6:20 AM

## 2022-09-25 NOTE — Progress Notes (Addendum)
STROKE TEAM PROGRESS NOTE   INTERVAL HISTORY Her husband is at the bedside.  She is sitting up in bed in NAD.  She states Wednesday morning she woke AT 0400 with left arm numbness and then progressed to the left leg. She came in to be evaluated due to it persisting. MRI brain with acute right thalamic stroke  We will start ASA 81 mg daily and Plavix  daily for 3 weeks than ASA alone.  She states her left foot is still numb.  Echo is pending  MRI shows small right thalamic lacunar infarct.  There is old cavernoma in the right frontal white matter with remote age hemorrhage with subtle venous angioma Vitals:   09/25/22 0032 09/25/22 0300 09/25/22 0822 09/25/22 1205  BP: (!) 146/75 138/85 (!) 155/95 (!) 157/90  Pulse: 78 64 68 (!) 58  Resp: Temp: 98.3 F (36.8 C) 97.9 F (36.6 C) 97.8 F (36.6 C) 98 F (36.7 C)  TempSrc: Oral Oral Oral Oral  SpO2: 97% 97% 98% 98%  Weight:      Height:       CBC:  Recent Labs  Lab 09/24/22 0609 09/25/22 0447  WBC 6.8 7.5  NEUTROABS 3.8  --   HGB 13.2 13.0  HCT 38.5 37.8  MCV 87.9 88.3  PLT 268 266   Basic Metabolic Panel:  Recent Labs  Lab 09/24/22 0609 09/25/22 0447  NA 138 138  K 3.5 3.7  CL 104 107  CO2 24 25  GLUCOSE 137* 119*  BUN 32* 25*  CREATININE 0.94 0.99  CALCIUM 8.8* 8.8*   Lipid Panel: No results for input(s): "CHOL", "TRIG", "HDL", "CHOLHDL", "VLDL", "LDLCALC" in the last 168 hours. HgbA1c:  Recent Labs  Lab 09/25/22 0447  HGBA1C 5.9*   Urine Drug Screen: No results for input(s): "LABOPIA", "COCAINSCRNUR", "LABBENZ", "AMPHETMU", "THCU", "LABBARB" in the last 168 hours.  Alcohol Level  Recent Labs  Lab 09/24/22 0610  ETH <10    IMAGING past 24 hours ECHOCARDIOGRAM COMPLETE BUBBLE STUDY  Result Date: 09/25/2022    ECHOCARDIOGRAM REPORT   Patient Name:   Samantha Castro Date of Exam: 09/25/2022 Medical Rec #:  [id number]           Height:       64.0 in Accession #:    [id number]           Weight:       210.0 lb Date of Birth:  [birth year]           BSA:          1.997 m Patient Age:    52 years            BP:           138/85 mmHg Patient Gender: F                   HR:           58 bpm. Exam Location:  Inpatient Procedure: 2D Echo, Cardiac Doppler, Color Doppler and Saline Contrast Bubble            Study Indications:    Stroke 434.91/I63.9  History:        Patient has no prior history of Echocardiogram examinations.                 Risk Factors:Hypertension and Sleep Apnea.  Sonographer:    Lucendia Herrlich Referring Phys: Erick Blinks IMPRESSIONS  1. Left  ventricular ejection fraction, by estimation, is 60 to 65%. The left ventricle has normal function. The left ventricle has no regional wall motion abnormalities. Left ventricular diastolic parameters were normal.  2. Right ventricular systolic function is normal. The right ventricular size is normal. Tricuspid regurgitation signal is inadequate for assessing PA pressure.  3. The mitral valve is normal in structure. Trivial mitral valve regurgitation. No evidence of mitral stenosis.  4. The aortic valve is normal in structure. Aortic valve regurgitation is not visualized. No aortic stenosis is present.  5. The inferior vena cava is normal in size with greater than 50% respiratory variability, suggesting right atrial pressure of 3 mmHg.  6. Agitated saline contrast bubble study was negative, with no evidence of any interatrial shunt. Conclusion(s)/Recommendation(s): No intracardiac source of embolism detected on this transthoracic study. Consider a transesophageal echocardiogram to exclude cardiac source of embolism if clinically indicated. FINDINGS  Left Ventricle: Left ventricular ejection fraction, by estimation, is 60 to 65%. The left ventricle has normal function. The left ventricle has no regional wall motion abnormalities. The left ventricular internal cavity size was normal in size. There is  no left ventricular hypertrophy. Left  ventricular diastolic parameters were normal. Normal left ventricular filling pressure. Right Ventricle: The right ventricular size is normal. No increase in right ventricular wall thickness. Right ventricular systolic function is normal. Tricuspid regurgitation signal is inadequate for assessing PA pressure. Left Atrium: Left atrial size was normal in size. Right Atrium: Right atrial size was normal in size. Pericardium: There is no evidence of pericardial effusion. Mitral Valve: The mitral valve is normal in structure. Mild mitral annular calcification. Trivial mitral valve regurgitation. No evidence of mitral valve stenosis. Tricuspid Valve: The tricuspid valve is normal in structure. Tricuspid valve regurgitation is trivial. No evidence of tricuspid stenosis. Aortic Valve: The aortic valve is normal in structure. Aortic valve regurgitation is not visualized. No aortic stenosis is present. Aortic valve peak gradient measures 6.2 mmHg. Pulmonic Valve: The pulmonic valve was normal in structure. Pulmonic valve regurgitation is trivial. No evidence of pulmonic stenosis. Aorta: The aortic root is normal in size and structure. Venous: The inferior vena cava is normal in size with greater than 50% respiratory variability, suggesting right atrial pressure of 3 mmHg. IAS/Shunts: No atrial level shunt detected by color flow Doppler. Agitated saline contrast was given intravenously to evaluate for intracardiac shunting. Agitated saline contrast bubble study was negative, with no evidence of any interatrial shunt.  LEFT VENTRICLE PLAX 2D LVIDd:         4.30 cm   Diastology LVIDs:         3.10 cm   LV e' medial:    12.00 cm/s LV PW:         1.20 cm   LV E/e' medial:  5.2 LV IVS:        1.00 cm   LV e' lateral:   9.03 cm/s LVOT diam:     1.90 cm   LV E/e' lateral: 6.9 LV SV:         51 LV SV Index:   26 LVOT Area:     2.84 cm  RIGHT VENTRICLE             IVC RV S prime:     13.90 cm/s  IVC diam: 1.60 cm TAPSE (M-mode): 1.8  cm LEFT ATRIUM             Index        RIGHT ATRIUM  Index LA diam:        4.10 cm 2.05 cm/m   RA Area:     8.34 cm LA Vol (A2C):   50.1 ml 25.08 ml/m  RA Volume:   12.50 ml 6.26 ml/m LA Vol (A4C):   49.8 ml 24.93 ml/m LA Biplane Vol: 54.9 ml 27.49 ml/m  AORTIC VALVE AV Area (Vmax): 1.81 cm AV Vmax:        124.00 cm/s AV Peak Grad:   6.2 mmHg LVOT Vmax:      79.33 cm/s LVOT Vmean:     50.200 cm/s LVOT VTI:       0.180 m  AORTA Ao Root diam: 3.10 cm Ao Asc diam:  3.30 cm MITRAL VALVE MV Area (PHT): 4.21 cm    SHUNTS MV Decel Time: 180 msec    Systemic VTI:  0.18 m MV E velocity: 62.70 cm/s  Systemic Diam: 1.90 cm MV A velocity: 48.10 cm/s MV E/A ratio:  1.30 Armanda Magic MD Electronically signed by Armanda Magic MD Signature Date/Time: 09/25/2022/12:55:16 PM    Final     PHYSICAL EXAM  Temp:  [97.8 F (36.6 C)-98.3 F (36.8 C)] 98 F (36.7 C) (04/25 1205) Pulse Rate:  [58-78] 58 (04/25 1205) Resp:  [16-17] 16 (04/25 1205) BP: (138-157)/(75-95) 157/90 (04/25 1205) SpO2:  [97 %-100 %] 98 % (04/25 1205)  General - Well nourished, well developed pleasant middle-age lady, in no apparent distress. Cardiovascular - Regular rhythm and rate.  Mental Status -  Level of arousal and orientation to time, place, and person were intact. Language including expression, naming, repetition, comprehension was assessed and found intact. Attention span and concentration were normal. Recent and remote memory were intact. Fund of Knowledge was assessed and was intact.  Cranial Nerves II - XII - II - Visual field intact OU. III, IV, VI - Extraocular movements intact. V - Facial sensation intact bilaterally. VII - Facial movement intact bilaterally. VIII - Hearing & vestibular intact bilaterally. X - Palate elevates symmetrically. XI - Chin turning & shoulder shrug intact bilaterally. XII - Tongue protrusion intact.  Motor Strength - The patient's strength was normal in all extremities and  pronator drift was absent.  Bulk was normal and fasciculations were absent.   Motor Tone - Muscle tone was assessed at the neck and appendages and was normal.  Sensory - left foot numb   Coordination - The patient had normal movements in the hands and feet with no ataxia or dysmetria.  Tremor was absent.  Gait and Station - deferred.  ASSESSMENT/PLAN Samantha Castro is a 52 y.o. female with history of   Stroke: Small acute right thalamic ischemic infarct  Etiology:  small vessel disease     Incidental right frontal cavernoma with remote age hemorrhage and small venous angioma CT head  4 mm high-density area in the right centrum semiovale small bleed, calcification, or cavernoma could give this appearance. CTA head & neck no LVO   MRI  Punctate acute infarction in the lateral right thalamus,   2D Echo EF 60-65%.  LDL 139 HgbA1c 5.9 VTE prophylaxis - SCD's    Diet   Diet regular Room service appropriate? Yes; Fluid consistency: Thin   No antithrombotic prior to admission, now on aspirin 81 mg daily and clopidogrel 75 mg daily. For 3 weeks than ASA alone  Will need outpatient follow up with neurology in 8 weeks after discharge  Therapy recommendations:  pending Disposition:  pending  Hypertension Home  meds:  amlodipine 5mg , valsartan 160mg ,  Stable Permissive hypertension (OK if < 220/120) but gradually normalize in 5-7 days Long-term BP goal normotensive  Hyperlipidemia Home meds:  none LDL 139, goal < 70 Add atorvastatin 40mg    Continue statin at discharge  Other Stroke Risk Factors Obesity, Body mass index is 36.05 kg/m., BMI >/= 30 associated with increased stroke risk, recommend weight loss, diet and exercise as appropriate  Obstructive sleep apnea,   Other Active Problems GERD Asthma Hx of melanoma  Hospital day # 0  Gevena Mart DNP, ACNPC-AG  [facility] I have personally obtained history,examined this patient, reviewed notes,  independently viewed imaging studies, participated in medical decision making and plan of care.ROS completed by me personally and pertinent positives fully documented  I have made any additions or clarifications directly to the above note. Agree with note above.  Patient presented with left body paresthesias due to right thalamic infarct and small vessel disease.  She also has a incidental small right frontal cavernoma with remote age hemorrhage with small venous angioma which is incidental.  Recommend aspirin and Plavix for 3 weeks followed by aspirin alone and aggressive risk factor modification.  Greater than 50% time during this 50-minute visit was spent on counseling and coordination of care about her lacunar stroke  and remote cavernoma and discussion about stroke evaluation, prevention and treatment and answering questions.  Stroke team will sign off.  Follow-up with an outpatient stroke clinic in 2 months with nurse practitioner.  Delia Heady, MD Medical Director [facility] Pager: [phone] 09/25/2022 2:40 PM   To contact Stroke Continuity provider, please refer to [email or web address]. After hours, contact General Neurology

## 2022-10-08 ENCOUNTER — Encounter: Payer: Self-pay | Admitting: Neurology

## 2022-10-09 DIAGNOSIS — M545 Low back pain, unspecified: Secondary | ICD-10-CM | POA: Insufficient documentation

## 2022-10-09 DIAGNOSIS — M25521 Pain in right elbow: Secondary | ICD-10-CM | POA: Insufficient documentation

## 2022-10-09 DIAGNOSIS — M25571 Pain in right ankle and joints of right foot: Secondary | ICD-10-CM | POA: Insufficient documentation

## 2022-10-17 ENCOUNTER — Ambulatory Visit: Payer: PRIVATE HEALTH INSURANCE | Admitting: Internal Medicine

## 2022-11-06 ENCOUNTER — Ambulatory Visit: Payer: PRIVATE HEALTH INSURANCE | Admitting: Internal Medicine

## 2022-11-13 ENCOUNTER — Encounter: Payer: Self-pay | Admitting: Internal Medicine

## 2022-11-13 ENCOUNTER — Ambulatory Visit: Payer: PRIVATE HEALTH INSURANCE | Admitting: Internal Medicine

## 2022-11-13 VITALS — BP 158/90 | HR 80 | Temp 98.6°F | Ht 64.0 in | Wt 221.4 lb

## 2022-11-13 DIAGNOSIS — Z6837 Body mass index (BMI) 37.0-37.9, adult: Secondary | ICD-10-CM

## 2022-11-13 DIAGNOSIS — Z9889 Other specified postprocedural states: Secondary | ICD-10-CM

## 2022-11-13 DIAGNOSIS — I6381 Other cerebral infarction due to occlusion or stenosis of small artery: Secondary | ICD-10-CM

## 2022-11-13 DIAGNOSIS — Z8673 Personal history of transient ischemic attack (TIA), and cerebral infarction without residual deficits: Secondary | ICD-10-CM | POA: Insufficient documentation

## 2022-11-13 DIAGNOSIS — E7849 Other hyperlipidemia: Secondary | ICD-10-CM

## 2022-11-13 DIAGNOSIS — K219 Gastro-esophageal reflux disease without esophagitis: Secondary | ICD-10-CM | POA: Insufficient documentation

## 2022-11-13 DIAGNOSIS — Z0000 Encounter for general adult medical examination without abnormal findings: Secondary | ICD-10-CM

## 2022-11-13 DIAGNOSIS — R42 Dizziness and giddiness: Secondary | ICD-10-CM | POA: Diagnosis not present

## 2022-11-13 DIAGNOSIS — M4722 Other spondylosis with radiculopathy, cervical region: Secondary | ICD-10-CM

## 2022-11-13 DIAGNOSIS — M25571 Pain in right ankle and joints of right foot: Secondary | ICD-10-CM

## 2022-11-13 DIAGNOSIS — E66812 Obesity, class 2: Secondary | ICD-10-CM

## 2022-11-13 DIAGNOSIS — G4733 Obstructive sleep apnea (adult) (pediatric): Secondary | ICD-10-CM | POA: Diagnosis not present

## 2022-11-13 DIAGNOSIS — E785 Hyperlipidemia, unspecified: Secondary | ICD-10-CM | POA: Insufficient documentation

## 2022-11-13 DIAGNOSIS — I10 Essential (primary) hypertension: Secondary | ICD-10-CM

## 2022-11-13 DIAGNOSIS — M47812 Spondylosis without myelopathy or radiculopathy, cervical region: Secondary | ICD-10-CM | POA: Insufficient documentation

## 2022-11-13 DIAGNOSIS — R7303 Prediabetes: Secondary | ICD-10-CM

## 2022-11-13 DIAGNOSIS — M25521 Pain in right elbow: Secondary | ICD-10-CM

## 2022-11-13 DIAGNOSIS — K8020 Calculus of gallbladder without cholecystitis without obstruction: Secondary | ICD-10-CM | POA: Insufficient documentation

## 2022-11-13 DIAGNOSIS — R7989 Other specified abnormal findings of blood chemistry: Secondary | ICD-10-CM | POA: Insufficient documentation

## 2022-11-13 HISTORY — DX: Other cerebral infarction due to occlusion or stenosis of small artery: I63.81

## 2022-11-13 MED ORDER — MECLIZINE HCL 25 MG PO TABS
25.0000 mg | ORAL_TABLET | Freq: Three times a day (TID) | ORAL | 1 refills | Status: AC | PRN
Start: 2022-11-13 — End: ?

## 2022-11-13 MED ORDER — HYDROCHLOROTHIAZIDE 25 MG PO TABS
12.5000 mg | ORAL_TABLET | Freq: Every day | ORAL | 3 refills | Status: AC
Start: 2022-11-13 — End: ?

## 2022-11-13 MED ORDER — AMLODIPINE BESYLATE 10 MG PO TABS
10.0000 mg | ORAL_TABLET | Freq: Every day | ORAL | 3 refills | Status: AC
Start: 2022-11-13 — End: ?

## 2022-11-13 MED ORDER — ONDANSETRON HCL 4 MG PO TABS: 4.0000 mg | ORAL_TABLET | Freq: Three times a day (TID) | ORAL | 0 refills | Status: AC | PRN

## 2022-11-13 NOTE — Assessment & Plan Note (Addendum)
Recovering well Not following with neurology yet but its scheduled Off dual anti-platelet therapy (DAPT) on just asprin and statin Echocardiogram negative Heart monitoring negative for atrial fibrillation, so assumption is blood pressure. The stroke occurred on April 24th, likely due to high blood pressure and weight issues, with no evidence of a hypertensive stroke. She is currently on aspirin 81mg  daily for prevention. We will continue aspirin 81mg  daily, order a repeat MRI for new onset vertigo to rule out cerebellar involvement, and consider aggressive cholesterol management for further stroke prevention.  Possible Atrial Fibrillation: She has a history of weight issues and sleep apnea, which can lead to atrial fibrillation. We will encourage her to monitor for symptoms of atrial fibrillation and continue current management of sleep apnea with CPAP.

## 2022-11-13 NOTE — Assessment & Plan Note (Addendum)
Assessment: Hyperlipidemia: Recent labs show high LDL and total cholesterol. She is currently on Lipitor (atorvastatin). We will continue Lipitor, encourage a Mediterranean diet with an increased intake of avocados, and check cholesterol levels on July 25th.   We reviewed the patient's recent lipid panel results and discussed their understanding of cholesterol and its role in cardiovascular disease (CVD).   We encouraged the patient to improve their lipid profile as much as possible.  Plan: Diet and Exercise: We collaboratively developed a plan to optimize diet and exercise habits for cholesterol management. Resources were provided to support the creation of a personalized plan.  Improving Your Cholesterol: Diet: Focus on a Mediterranean-style diet, limit saturated fats and sugars, and increase omega-3 fatty acids (fish, flaxseeds,nuts,extra virgin olive oil, avocados). Exercise: Engage in regular physical activity (aerobic exercises are particularly beneficial for HDL). Weight Management: Maintain a healthy weight. Smoking Cessation: never smoker  Routine monitoring:  We collaborated to decide on when to next check levels.  - July 25 we need and Hemoglobin A1c so follow up then  Advanced Testing: We discussed the potential need for advanced testing beyond a basic lipid panel. This testing, which may include Lipoprotein(a), Apolipoprotein B (ApoB), and High-sensitivity C-reactive protein (hs-CRP), can provide a more detailed assessment of cardiovascular risk, especially in high cardiovascular risk situations (extreme lipid profiles, family history of cardiovascular disease at a young age, or personal history of diabetes, hypertension, obesity, heavy smoking)

## 2022-11-13 NOTE — Assessment & Plan Note (Signed)
Encouraged patient to follow lifestyle.

## 2022-11-13 NOTE — Assessment & Plan Note (Addendum)
To achieve the goal of lowering the patient's blood pressure to less than 130/80 mmHg, which is recommended post-stroke, we suggest considering an increase in the dose of amlodipine from 5 mg to 10 mg daily, after evaluating her tolerance to the current dose. Additionally, monitoring her blood pressure closely following this adjustment is crucial to assess the effectiveness and safety of the increased dosage. If this adjustment does not achieve the desired blood pressure control, further evaluation and possibly the addition of another antihypertensive agent may be necessary. This approach is aligned with the guidelines for the management of hypertension in patients with a history of stroke to minimize the risk of recurrence.Hypertension: Blood pressure was 150/90 on the current visit. She is currently on amlodipine 5mg  and Diovan 160mg . We will increase the amlodipine dose to manage blood pressure and consider adding hydrochlorothiazide if lower extremity edema develops.

## 2022-11-14 DIAGNOSIS — Z0000 Encounter for general adult medical examination without abnormal findings: Secondary | ICD-10-CM | POA: Insufficient documentation

## 2022-11-14 DIAGNOSIS — Z9889 Other specified postprocedural states: Secondary | ICD-10-CM | POA: Insufficient documentation

## 2022-11-14 NOTE — Progress Notes (Signed)
[facility]  Phone: [phone]  New patient visit  Visit Date: 11/13/2022 Patient: Samantha Castro   DOB: 1970/12/02   52 y.o. Female  MRN: [id number] PCP:  Lula Olszewski, MD  (establishing care today)  Today's Health Care Provider: Lula Olszewski, MD   Assessment and Plan:    Deleyza was seen today for new patient (initial visit).  Thalamic stroke (HCC) Overview: Had headache(s) untouchable for 3 weeks prior, mother had strokes. The patient, a 24 year old individual with a recent history of stroke, presented with concerns about persistent vertigo and blood pressure management. The stroke occurred on April 24th, initially presenting with numbness in the left arm and leg, which the patient initially attributed to a pre-existing bulging disc in the neck.   The patient also reported persistent pain in the right elbow and both ankles following a fall that occurred a week after the stroke. The patient has been seeing a specialist for these issues. The patient has a history of sleep apnea and is compliant with CPAP therapy. The patient also has a history of elevated blood pressure, which has been monitored at home, with readings ranging from 130 to 160 systolic over 90 to 100 diastolic.  The patient has a family history of diabetes and has been managing her blood glucose levels with diet and medication. Her most recent HbA1c was 5.9. The patient also reported a history of elevated cholesterol and is currently taking atorvastatin. The patient does not smoke or drink alcohol. 09/2022 MRI 1. 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 2. Minimal small-vessel change of the cerebral hemispheric white matter, progressive since 2010. 3. Small hyperdensity seen in the right centrum semiovale region by  Assessment & Plan: Recovering well Not following with neurology yet but its scheduled Off dual  anti-platelet therapy (DAPT) on just asprin and statin Echocardiogram negative Heart monitoring negative for atrial fibrillation, so assumption is blood pressure. The stroke occurred on April 24th, likely due to high blood pressure and weight issues, with no evidence of a hypertensive stroke. She is currently on aspirin 81mg  daily for prevention. We will continue aspirin 81mg  daily, order a repeat MRI for new onset vertigo to rule out cerebellar involvement, and consider aggressive cholesterol management for further stroke prevention.  Possible Atrial Fibrillation: She has a history of weight issues and sleep apnea, which can lead to atrial fibrillation. We will encourage her to monitor for symptoms of atrial fibrillation and continue current management of sleep apnea with CPAP.   Osteoarthritis of spine with radiculopathy, cervical region Overview: Upright frontal, lateral, flexion, and extension views of the cervical spine. 1. No acute fracture. 2. Trace retrolisthesis C6 on C7, similar between flexion and extension. No abnormal motion on flexion and extension. 3. Spondylosis/disc space height loss at C6-7. Facet arthropathy at the cervicothoracic junction.X-RAY CERVICAL SPINE (AP, LAT, FLEX/EX), 04/21/2022 12:57 PM   Vertigo Overview: November 14, 2022 interim history:   The patient's primary concern at this visit was the onset of vertigo approximately three weeks prior, which has been severe and debilitating at times. The vertigo does not appear to be consistently affected by changes in position. The patient has been self-managing with over-the-counter meclizine and Zofran, originally prescribed for a family member.  Assessment & Plan: Vertigo: She has been experiencing new onset vertigo for the past three weeks, which could be either peripheral (BPPV) or central (cerebellar). We will order an MRI to rule  out cerebellar involvement, refer her to vestibular rehab, and prescribe Antivert  (meclizine) for symptom management.  Orders: -     MR BRAIN W WO CONTRAST; Future -     Meclizine HCl; Take 1 tablet (25 mg total) by mouth 3 (three) times daily as needed for dizziness.  Dispense: 30 tablet; Refill: 1 -     PT Vestibular Evaluation; Future -     Ondansetron HCl; Take 1 tablet (4 mg total) by mouth every 8 (eight) hours as needed for nausea or vomiting.  Dispense: 20 tablet; Refill: 0  OSA (obstructive sleep apnea) Overview: Compliant with CPAP since 2015ish   Hypertension, unspecified type Overview: Interim history from November 13, 2022:    Home readings: range from 128-161, but mostly controlled in 140 average range.   Patient reports taking current medications consistently and not experiencing any significant associated side effects or symptoms. Current hypertension medications:       Sig   amLODipine (NORVASC) 5 MG tablet (Taking) Take by mouth in the morning.   valsartan (DIOVAN) 160 MG tablet (Taking) Take by mouth daily.      Lab Results  Component Value Date   NA 138 09/25/2022   K 3.7 09/25/2022   CREATININE 0.99 09/25/2022     Assessment & Plan: To achieve the goal of lowering the patient's blood pressure to less than 130/80 mmHg, which is recommended post-stroke, we suggest considering an increase in the dose of amlodipine from 5 mg to 10 mg daily, after evaluating her tolerance to the current dose. Additionally, monitoring her blood pressure closely following this adjustment is crucial to assess the effectiveness and safety of the increased dosage. If this adjustment does not achieve the desired blood pressure control, further evaluation and possibly the addition of another antihypertensive agent may be necessary. This approach is aligned with the guidelines for the management of hypertension in patients with a history of stroke to minimize the risk of recurrence.Hypertension: Blood pressure was 150/90 on the current visit. She is currently on amlodipine  5mg  and Diovan 160mg . We will increase the amlodipine dose to manage blood pressure and consider adding hydrochlorothiazide if lower extremity edema develops.  Orders: -     amLODIPine Besylate; Take 1 tablet (10 mg total) by mouth daily.  Dispense: 90 tablet; Refill: 3 -     hydroCHLOROthiazide; Take 0.5 tablets (12.5 mg total) by mouth daily.  Dispense: 90 tablet; Refill: 3  Prediabetes Overview: Lab Results  Component Value Date   HGBA1C 5.9 (H) 09/25/2022     Assessment & Plan: Encouraged patient to follow lifestyle.   Pain in joint of right ankle Overview: Already following with [facility]    Pain in joint of right elbow Overview: Following with [facility]   Other specified abnormal findings of blood chemistry Overview:    Other hyperlipidemia Overview: Medications: not yet discussed Lab Results  Component Value Date   HDL 58 02/15/2021   CHOLHDL 3.7 02/15/2021   Lab Results  Component Value Date   LDLCALC 139 (H) 02/15/2021   Lab Results  Component Value Date   TRIG 85 02/15/2021   Lab Results  Component Value Date   CHOL 212 (H) 02/15/2021   The ASCVD Risk score (Arnett DK, et al., 2019) failed to calculate for the following reasons:   The patient has a prior MI or stroke diagnosis Lab Results  Component Value Date   ALT 24 09/24/2022   AST 20 09/24/2022   ALKPHOS 77 09/24/2022  TSH 1.360 02/15/2021   HGBA1C 5.9 (H) 09/25/2022   Body mass index is 38 kg/m.  Lipoprotein(a), Apolipoprotein B (ApoB), and High-sensitivity C-reactive protein (hs-CRP) No results found for: "HSCRP", "LIPOA"  Assessment & Plan: Assessment: Hyperlipidemia: Recent labs show high LDL and total cholesterol. She is currently on Lipitor (atorvastatin). We will continue Lipitor, encourage a Mediterranean diet with an increased intake of avocados, and check cholesterol levels on July 25th.   We reviewed the patient's recent lipid panel results and discussed their  understanding of cholesterol and its role in cardiovascular disease (CVD).   We encouraged the patient to improve their lipid profile as much as possible.  Plan: Diet and Exercise: We collaboratively developed a plan to optimize diet and exercise habits for cholesterol management. Resources were provided to support the creation of a personalized plan.  Improving Your Cholesterol: Diet: Focus on a Mediterranean-style diet, limit saturated fats and sugars, and increase omega-3 fatty acids (fish, flaxseeds,nuts,extra virgin olive oil, avocados). Exercise: Engage in regular physical activity (aerobic exercises are particularly beneficial for HDL). Weight Management: Maintain a healthy weight. Smoking Cessation: never smoker  Routine monitoring:  We collaborated to decide on when to next check levels.  - July 25 we need and Hemoglobin A1c so follow up then  Advanced Testing: We discussed the potential need for advanced testing beyond a basic lipid panel. This testing, which may include Lipoprotein(a), Apolipoprotein B (ApoB), and High-sensitivity C-reactive protein (hs-CRP), can provide a more detailed assessment of cardiovascular risk, especially in high cardiovascular risk situations (extreme lipid profiles, family history of cardiovascular disease at a young age, or personal history of diabetes, hypertension, obesity, heavy smoking)    Healthcare maintenance Assessment & Plan: Follow-up: The next appointment is scheduled for July 25th for a cholesterol and A1c check. An earlier follow-up is possible for MRI results.    H/O major abdominal surgery Overview: The patient also has a history of multiple surgeries, including colon resection, hysterectomy, and appendectomy due to an endometrial mass and severe endometriosis. Postoperatively, the patient experienced complications including suture rupture and severe bleeding, necessitating a blood transfusion.   Class 2 severe obesity due to excess  calories with serious comorbidity and body mass index (BMI) of 37.0 to 37.9 in adult [facility]) Overview:  The patient has been managing her weight with medications including Mounjaro, Wegovy, and most recently Zepbound, but has not restarted the latter due to recent health events. The patient reported a history of elevated liver enzymes and a suspected episode of pancreatitis, which was attributed to the weight loss medication Wegovy. However, the diagnosis of pancreatitis was not confirmed.  Increased body mass noted.  Body mass index is 38 kg/m.  Well proportioned with no abnormal fat distribution.  Good muscle tone.  Lab Results  Component Value Date   TSH 1.360 02/15/2021   Lab Results  Component Value Date   HGBA1C 5.9 (H) 09/25/2022    Wt Readings from Last 10 Encounters:  11/13/22 221 lb 6.4 oz (100.4 kg)  09/24/22 210 lb (95.3 kg)  12/22/21 187 lb (84.8 kg)  08/21/21 190 lb (86.2 kg)  08/01/21 189 lb (85.7 kg)  07/08/21 193 lb (87.5 kg)  06/19/21 193 lb (87.5 kg)  05/30/21 197 lb (89.4 kg)  04/30/21 203 lb (92.1 kg)  04/10/21 208 lb (94.3 kg)               Clinical Presentation:    Patient affirms intent to establish a primary care provider relationship with  Lula Olszewski, MD going forward.  52 y.o. female  with main concerns (chief complaints) today expressed as New Patient (Initial Visit)   Discussed the use of AI scribe software for clinical note transcription with the patient, who gave verbal consent to proceed.  History of Present Illness   The patient, a 52 year old individual with a recent history of stroke, presented with concerns about persistent vertigo and blood pressure management. The stroke occurred on April 24th, initially presenting with numbness in the left arm and leg, which the patient initially attributed to a pre-existing bulging disc in the neck. The patient also has a history of multiple surgeries, including colon resection, hysterectomy, and  appendectomy due to an endometrial mass and severe endometriosis. Postoperatively, the patient experienced complications including suture rupture and severe bleeding, necessitating a blood transfusion.  The patient has been managing her weight with medications including Mounjaro, Wegovy, and most recently Zepbound, but has not restarted the latter due to recent health events. The patient reported a history of elevated liver enzymes and a suspected episode of pancreatitis, which was attributed to the weight loss medication Wegovy. However, the diagnosis of pancreatitis was not confirmed.  The patient's primary concern at this visit was the onset of vertigo approximately three weeks prior, which has been severe and debilitating at times. The vertigo does not appear to be consistently affected by changes in position. The patient has been self-managing with over-the-counter meclizine and Zofran, originally prescribed for a family member.  The patient also reported persistent pain in the right elbow and both ankles following a fall that occurred a week after the stroke. The patient has been seeing a specialist for these issues. The patient has a history of sleep apnea and is compliant with CPAP therapy. The patient also has a history of elevated blood pressure, which has been monitored at home, with readings ranging from 130 to 160 systolic over 90 to 100 diastolic.  The patient has a family history of diabetes and has been managing her blood glucose levels with diet and medication. Her most recent HbA1c was 5.9. The patient also reported a history of elevated cholesterol and is currently taking atorvastatin. The patient does not smoke or drink alcohol.        Comprehensive chart development today: Problems:has History of melanoma; Family history of breast cancer; Family history of colon cancer; Genetic testing; Prediabetes; Hypertension; Class 2 severe obesity with serious comorbidity and body mass index  (BMI) of 37.0 to 37.9 in adult [facility]); Acute cerebral infarction (HCC); Cerebral cavernoma; OSA (obstructive sleep apnea); Low back pain; Pain in joint of right elbow; Pain in joint of right ankle; DJD (degenerative joint disease) of cervical spine; Cholelithiasis; Gastroesophageal reflux disease without esophagitis; Other specified abnormal findings of blood chemistry; Thalamic stroke (HCC); Vertigo; Hyperlipidemia; Healthcare maintenance; and H/O major abdominal surgery on their problem list. Current Meds  Medication Sig   amLODipine (NORVASC) 10 MG tablet Take 1 tablet (10 mg total) by mouth daily.   aspirin EC 81 MG tablet Take 1 tablet (81 mg total) by mouth daily. Swallow whole.   atorvastatin (LIPITOR) 40 MG tablet Take 1 tablet (40 mg total) by mouth daily.   Biotin 13086 MCG TABS Take 1 capsule by mouth daily.   diclofenac (VOLTAREN) 75 MG EC tablet Take 75 mg by mouth daily.   EPINEPHrine 0.3 mg/0.3 mL IJ SOAJ injection Use contents of one pen to inject into the muslce once as needed for life threatening reaction. May repeat  second time after first seeking emergency medical attention. for 30 days   gabapentin (NEURONTIN) 100 MG capsule Take by mouth as needed. 1-2 at night for hot flashes.   hydrochlorothiazide (HYDRODIURIL) 25 MG tablet Take 0.5 tablets (12.5 mg total) by mouth daily.   hydrOXYzine (ATARAX/VISTARIL) 10 MG tablet Take 10 mg by mouth continuous as needed for itching or anxiety (Use as needed for flare).   linaclotide (LINZESS) 290 MCG CAPS capsule Take 1 capsule (290 mcg total) by mouth daily before breakfast.   meclizine (ANTIVERT) 25 MG tablet Take 1 tablet (25 mg total) by mouth 3 (three) times daily as needed for dizziness.   Meth-Hyo-M Bl-Na Phos-Ph Sal (URIBEL) 118 MG CAPS Take 118 mg by mouth continuous as needed (Only as needed for flare).   Misc. Devices MISC Initiate CPAP therapy at 10 cm. water pressure with EPR 3.  CPAP machine, mask and supplies for OSA.  F&P Eson  II nasal mask or patient preference.  Send to [place].   nitrofurantoin (MACRODANTIN) 100 MG capsule Take 100 mg by mouth 4 (four) times daily. As needed.   ondansetron (ZOFRAN) 4 MG tablet Take 1 tablet (4 mg total) by mouth every 8 (eight) hours as needed for nausea or vomiting.   OVER THE COUNTER MEDICATION daily. Turbo Power Vitamin/Super Immunity   oxybutynin (DITROPAN) 5 MG tablet Take 5 mg by mouth daily as needed for bladder spasms.   pantoprazole (PROTONIX) 40 MG tablet Take 40 mg by mouth daily.   tirzepatide (ZEPBOUND) 2.5 MG/0.5ML Pen Inject 2.5mg  under the skin once weekly. for 90 days   tirzepatide (ZEPBOUND) 5 MG/0.5ML Pen Inject 5mg  under the skin once weekly. for 90 days   tiZANidine (ZANAFLEX) 4 MG tablet Take 4 mg by mouth every 6 (six) hours as needed for muscle spasms.   UNABLE TO FIND in the morning and at bedtime. Omega 7 complete.   valsartan (DIOVAN) 160 MG tablet Take by mouth daily.   VITAMIN D PO Take by mouth daily. 5000 units .   [DISCONTINUED] amLODipine (NORVASC) 5 MG tablet Take by mouth in the morning.   Allergies:  reports no history of alcohol use. Past Medical History  has a past medical history of Allergic rhinitis, Anemia, Asthma, Back pain, Blood transfusion without reported diagnosis, Cancer (HCC), Constipation, Endometriosis, GERD (gastroesophageal reflux disease), History of kidney stones, History of melanoma, Hypertension, Interstitial cystitis, Lower extremity edema, PONV (postoperative nausea and vomiting), Sleep apnea, Stroke (HCC), and Thalamic stroke (HCC) (11/13/2022). Past Surgical History  has a past surgical history that includes Abdominal hysterectomy; Appendectomy; Colon surgery; Arthroscopic repair ACL; Pelvic laparoscopy; Melanoma excision; Colon resection; Excision melanoma with sentinel lymph node biopsy (Right, 12/15/2016); and Irrigation and debridement abscess (Right, 12/28/2016). Social History  reports that she has never smoked. She has  never used smokeless tobacco. She reports that she does not drink alcohol and does not use drugs. Family History family history includes Breast cancer in her paternal aunt; Breast cancer (age of onset: 22) in her paternal grandmother; Cancer in her father and mother; Colon cancer in her mother; Heart disease in her father and paternal grandmother; Hypertension in her father and mother; Lung cancer in her maternal uncle and maternal uncle; Lung cancer (age of onset: 49) in her father; Lung cancer (age of onset: 50) in her mother; Melanoma in her cousin; Pancreatic cancer in her maternal uncle; Stroke in her mother.  Depression Screen and Health Maintenance:    11/13/2022    1:57  PM 02/15/2021    8:28 AM  PHQ 2/9 Scores  PHQ - 2 Score 0 6  PHQ- 9 Score  23   Health Maintenance  Topic Date Due   PAP SMEAR-Modifier  Never done   Colonoscopy  Never done   MAMMOGRAM  08/21/2020   INFLUENZA VACCINE  01/01/2023   DTaP/Tdap/Td (9 - Td or Tdap) 08/18/2032   HIV Screening  Completed   HPV VACCINES  Aged Out   COVID-19 Vaccine  Discontinued   Hepatitis C Screening  Discontinued   Zoster Vaccines- Shingrix  Discontinued   Immunization History  Administered Date(s) Administered   DTaP 07/30/1970, 10/17/1970, 11/26/1970, 01/31/1972, 03/24/1986   Hepatitis B, ADULT 04/03/1995, 10/01/1995, 04/02/1996   Hepatitis B, PED/ADOLESCENT 04/03/1995, 10/01/1995, 04/02/1996   IPV 10/17/1970, 11/26/1970, 01/28/1971, 01/31/1972   Influenza Inj Mdck Quad Pf 02/17/2022   Influenza Inj Mdck Quad With Preservative 03/24/2018   Influenza Split 02/23/2019   Influenza, High Dose Seasonal PF 02/23/2019   Influenza, Quadrivalent, Recombinant, Inj, Pf 01/31/2020   Influenza,inj,Quad PF,6+ Mos 02/24/2017   Influenza,trivalent, recombinat, inj, PF 03/14/2009, 03/02/2018   Influenza-Unspecified 03/14/2009, 02/24/2017, 03/02/2018, 03/21/2021   MMR 08/22/1971, 03/10/1994   PPD Test 08/29/2013, 10/22/2015, 11/06/2015   Td  06/21/1992   Td (Adult),5 Lf Tetanus Toxid, Preservative Free 06/21/1992   Tdap 06/03/2011, 08/19/2022        Objective:  Physical Exam  BP (!) 158/90 (BP Location: Left Arm, Patient Position: Sitting)   Pulse 80   Temp 98.6 F (37 C) (Temporal)   Ht 5\' 4"  (1.626 m)   Wt 221 lb 6.4 oz (100.4 kg)   SpO2 99%   BMI 38.00 kg/m  Wt Readings from Last 10 Encounters:  11/13/22 221 lb 6.4 oz (100.4 kg)  09/24/22 210 lb (95.3 kg)  12/22/21 187 lb (84.8 kg)  08/21/21 190 lb (86.2 kg)  08/01/21 189 lb (85.7 kg)  07/08/21 193 lb (87.5 kg)  06/19/21 193 lb (87.5 kg)  05/30/21 197 lb (89.4 kg)  04/30/21 203 lb (92.1 kg)  04/10/21 208 lb (94.3 kg)   Vital signs reviewed.  Nursing notes reviewed. Weight trend reviewed. Abnormalities and problem-specific physical exam findings:  mildly dry tongue, truncal adiposity, walks without assistance General Appearance:  Well developed, well nourished, well-groomed, healthy-appearing female with Body mass index is 38 kg/m. No acute distress appreciable.   Skin: Clear and well-hydrated. Pulmonary:  Normal work of breathing at rest, no respiratory distress apparent. SpO2: 99 %  Musculoskeletal: She demonstrates smooth and coordinated movements throughout all major joints.All extremities are intact.  Neurological:  Awake, alert, oriented, and engaged.  No obvious focal neurological deficits or cognitive impairments.  Sensorium seems unclouded.  Psychiatric:  Appropriate mood, pleasant and cooperative demeanor, cheerful and engaged during the exam  Reviewed Results    Results for orders placed or performed during the hospital encounter of 09/24/22  Ethanol  Result Value Ref Range   Alcohol, Ethyl (B) <10 <10 mg/dL  Protime-INR  Result Value Ref Range   Prothrombin Time 12.5 11.4 - 15.2 seconds   INR 0.9 0.8 - 1.2  APTT  Result Value Ref Range   aPTT 30 24 - 36 seconds  CBC  Result Value Ref Range   WBC 6.8 4.0 - 10.5 K/uL   RBC 4.38 3.87 -  5.11 MIL/uL   Hemoglobin 13.2 12.0 - 15.0 g/dL   HCT 82.9 56.2 - 13.0 %   MCV 87.9 80.0 - 100.0 fL   MCH 30.1 26.0 -  34.0 pg   MCHC 34.3 30.0 - 36.0 g/dL   RDW 29.5 28.4 - 13.2 %   Platelets 268 150 - 400 K/uL   nRBC 0.0 0.0 - 0.2 %  Differential  Result Value Ref Range   Neutrophils Relative % 56 %   Neutro Abs 3.8 1.7 - 7.7 K/uL   Lymphocytes Relative 34 %   Lymphs Abs 2.3 0.7 - 4.0 K/uL   Monocytes Relative 5 %   Monocytes Absolute 0.4 0.1 - 1.0 K/uL   Eosinophils Relative 4 %   Eosinophils Absolute 0.3 0.0 - 0.5 K/uL   Basophils Relative 1 %   Basophils Absolute 0.1 0.0 - 0.1 K/uL   Immature Granulocytes 0 %   Abs Immature Granulocytes 0.02 0.00 - 0.07 K/uL  Comprehensive metabolic panel  Result Value Ref Range   Sodium 138 135 - 145 mmol/L   Potassium 3.5 3.5 - 5.1 mmol/L   Chloride 104 98 - 111 mmol/L   CO2 24 22 - 32 mmol/L   Glucose, Bld 137 (H) 70 - 99 mg/dL   BUN 32 (H) 6 - 20 mg/dL   Creatinine, Ser 4.40 0.44 - 1.00 mg/dL   Calcium 8.8 (L) 8.9 - 10.3 mg/dL   Total Protein 7.6 6.5 - 8.1 g/dL   Albumin 4.0 3.5 - 5.0 g/dL   AST 20 15 - 41 U/L   ALT 24 0 - 44 U/L   Alkaline Phosphatase 77 38 - 126 U/L   Total Bilirubin 0.5 0.3 - 1.2 mg/dL   GFR, Estimated >10 >27 mL/min   Anion gap 10 5 - 15  Basic metabolic panel  Result Value Ref Range   Sodium 138 135 - 145 mmol/L   Potassium 3.7 3.5 - 5.1 mmol/L   Chloride 107 98 - 111 mmol/L   CO2 25 22 - 32 mmol/L   Glucose, Bld 119 (H) 70 - 99 mg/dL   BUN 25 (H) 6 - 20 mg/dL   Creatinine, Ser 2.53 0.44 - 1.00 mg/dL   Calcium 8.8 (L) 8.9 - 10.3 mg/dL   GFR, Estimated >66 >44 mL/min   Anion gap 6 5 - 15  CBC  Result Value Ref Range   WBC 7.5 4.0 - 10.5 K/uL   RBC 4.28 3.87 - 5.11 MIL/uL   Hemoglobin 13.0 12.0 - 15.0 g/dL   HCT 03.4 74.2 - 59.5 %   MCV 88.3 80.0 - 100.0 fL   MCH 30.4 26.0 - 34.0 pg   MCHC 34.4 30.0 - 36.0 g/dL   RDW 63.8 75.6 - 43.3 %   Platelets 266 150 - 400 K/uL   nRBC 0.0 0.0 - 0.2 %   HIV Antibody (routine testing w rflx)  Result Value Ref Range   HIV Screen 4th Generation wRfx Non Reactive Non Reactive  Hemoglobin A1c  Result Value Ref Range   Hgb A1c MFr Bld 5.9 (H) 4.8 - 5.6 %   Mean Plasma Glucose 122.63 mg/dL  ECHOCARDIOGRAM COMPLETE BUBBLE STUDY  Result Value Ref Range   S' Lateral 3.10 cm   AR max vel 1.81 cm2   AV Peak grad 6.2 mmHg   Ao pk vel 1.24 m/s   Area-P 1/2 4.21 cm2   Est EF 60 - 65%     No results found for any visits on 11/13/22.  Recent Results (from the past 2160 hour(s))  Protime-INR     Status: None   Collection Time: 09/24/22  6:09 AM  Result Value Ref Range  Prothrombin Time 12.5 11.4 - 15.2 seconds   INR 0.9 0.8 - 1.2    Comment: (NOTE) INR goal varies based on device and disease states. Performed at [facility], [address] [place], [place] [address]   APTT     Status: None   Collection Time: 09/24/22  6:09 AM  Result Value Ref Range   aPTT 30 24 - 36 seconds    Comment: Performed at [facility], [address] [place], [place] [address]  CBC     Status: None   Collection Time: 09/24/22  6:09 AM  Result Value Ref Range   WBC 6.8 4.0 - 10.5 K/uL   RBC 4.38 3.87 - 5.11 MIL/uL   Hemoglobin 13.2 12.0 - 15.0 g/dL   HCT 42.5 95.6 - 38.7 %   MCV 87.9 80.0 - 100.0 fL   MCH 30.1 26.0 - 34.0 pg   MCHC 34.3 30.0 - 36.0 g/dL   RDW 56.4 33.2 - 95.1 %   Platelets 268 150 - 400 K/uL   nRBC 0.0 0.0 - 0.2 %    Comment: Performed at [facility], [address] [place], [place] [address]  Differential     Status: None   Collection Time: 09/24/22  6:09 AM  Result Value Ref Range   Neutrophils Relative % 56 %   Neutro Abs 3.8 1.7 - 7.7 K/uL   Lymphocytes Relative 34 %   Lymphs Abs 2.3 0.7 - 4.0 K/uL   Monocytes Relative 5 %   Monocytes Absolute 0.4 0.1 - 1.0 K/uL   Eosinophils Relative 4 %   Eosinophils Absolute 0.3 0.0 - 0.5 K/uL   Basophils Relative 1 %   Basophils Absolute 0.1 0.0 -  0.1 K/uL   Immature Granulocytes 0 %   Abs Immature Granulocytes 0.02 0.00 - 0.07 K/uL    Comment: Performed at [facility], [address] [place], [place] [address]  Comprehensive metabolic panel     Status: Abnormal   Collection Time: 09/24/22  6:09 AM  Result Value Ref Range   Sodium 138 135 - 145 mmol/L   Potassium 3.5 3.5 - 5.1 mmol/L   [place] 104 98 - 111 mmol/L   CO2 24 22 - 32 mmol/L   Glucose, Bld 137 (H) 70 - 99 mg/dL    Comment: Glucose reference range applies only to samples taken after fasting for at least 8 hours.   BUN 32 (H) 6 - 20 mg/dL   Creatinine, Ser 1.60 0.44 - 1.00 mg/dL   Calcium 8.8 (L) 8.9 - 10.3 mg/dL   Total Protein 7.6 6.5 - 8.1 g/dL   Albumin 4.0 3.5 - 5.0 g/dL   AST 20 15 - 41 U/L   ALT 24 0 - 44 U/L   Alkaline Phosphatase 77 38 - 126 U/L   Total Bilirubin 0.5 0.3 - 1.2 mg/dL   GFR, Estimated >10 >93 mL/min    Comment: (NOTE) Calculated using the CKD-EPI Creatinine Equation (2021)    Anion gap 10 5 - 15    Comment: Performed at [facility], [address] [place], [place] [address]  Ethanol     Status: None   Collection Time: 09/24/22  6:10 AM  Result Value Ref Range   Alcohol, Ethyl (B) <10 <10 mg/dL    Comment: (NOTE) Lowest detectable limit for serum alcohol is 10 mg/dL.  For medical purposes only. Performed at [facility], [address] [place], [place] [address]   Basic metabolic panel     Status: Abnormal   Collection Time: 09/25/22  4:47 AM  Result Value Ref Range   Sodium 138 135 - 145 mmol/L   Potassium 3.7 3.5 - 5.1 mmol/L   [place] 107 98 - 111 mmol/L   CO2 25 22 - 32 mmol/L   Glucose, Bld 119 (H) 70 - 99 mg/dL    Comment: Glucose reference range applies only to samples taken after fasting for at least 8 hours.   BUN 25 (H) 6 - 20 mg/dL   Creatinine, Ser 9.14 0.44 - 1.00 mg/dL   Calcium 8.8 (L) 8.9 - 10.3 mg/dL   GFR, Estimated >78 >29 mL/min    Comment: (NOTE) Calculated using  the CKD-EPI Creatinine Equation (2021)    Anion gap 6 5 - 15    Comment: Performed at [facility], [address] [place], [place] [address]  CBC     Status: None   Collection Time: 09/25/22  4:47 AM  Result Value Ref Range   WBC 7.5 4.0 - 10.5 K/uL   RBC 4.28 3.87 - 5.11 MIL/uL   Hemoglobin 13.0 12.0 - 15.0 g/dL   HCT 08.6 57.8 - 46.9 %   MCV 88.3 80.0 - 100.0 fL   MCH 30.4 26.0 - 34.0 pg   MCHC 34.4 30.0 - 36.0 g/dL   RDW 62.9 52.8 - 41.3 %   Platelets 266 150 - 400 K/uL   nRBC 0.0 0.0 - 0.2 %    Comment: Performed at [facility], [address], [place], [place] [address]  HIV Antibody (routine testing w rflx)     Status: None   Collection Time: 09/25/22  4:47 AM  Result Value Ref Range   HIV Screen 4th Generation wRfx Non Reactive Non Reactive    Comment: Performed at [facility], [address], [place], [place] [address]  Hemoglobin A1c     Status: Abnormal   Collection Time: 09/25/22  4:47 AM  Result Value Ref Range   Hgb A1c MFr Bld 5.9 (H) 4.8 - 5.6 %    Comment: (NOTE) Pre diabetes:          5.7%-6.4%  Diabetes:              >6.4%  Glycemic control for   <7.0% adults with diabetes    Mean Plasma Glucose 122.63 mg/dL    Comment: Performed at [facility], [address], [place], [place] [address]  ECHOCARDIOGRAM COMPLETE BUBBLE STUDY     Status: None   Collection Time: 09/25/22 11:49 AM  Result Value Ref Range   S' Lateral 3.10 cm   AR max vel 1.81 cm2   AV Peak grad 6.2 mmHg   Ao pk vel 1.24 m/s   Area-P 1/2 4.21 cm2   Est EF 60 - 65%     No image results found.   ECHOCARDIOGRAM COMPLETE BUBBLE STUDY  Result Date: 09/25/2022    ECHOCARDIOGRAM REPORT   Patient Name:   Luceil Dansky Date of Exam: 09/25/2022 Medical Rec #:  [id number]           Height:       64.0 in Accession #:    [id number]          Weight:       210.0 lb Date of Birth:  1970-11-14  BSA:          1.997 m Patient Age:    52 years            BP:            138/85 mmHg Patient Gender: F                   HR:           58 bpm. Exam Location:  Inpatient Procedure: 2D Echo, Cardiac Doppler, Color Doppler and Saline Contrast Bubble            Study Indications:    Stroke 434.91/I63.9  History:        Patient has no prior history of Echocardiogram examinations.                 Risk Factors:Hypertension and Sleep Apnea.  Sonographer:    Lucendia Herrlich Referring Phys: Erick Blinks IMPRESSIONS  1. Left ventricular ejection fraction, by estimation, is 60 to 65%. The left ventricle has normal function. The left ventricle has no regional wall motion abnormalities. Left ventricular diastolic parameters were normal.  2. Right ventricular systolic function is normal. The right ventricular size is normal. Tricuspid regurgitation signal is inadequate for assessing PA pressure.  3. The mitral valve is normal in structure. Trivial mitral valve regurgitation. No evidence of mitral stenosis.  4. The aortic valve is normal in structure. Aortic valve regurgitation is not visualized. No aortic stenosis is present.  5. The inferior vena cava is normal in size with greater than 50% respiratory variability, suggesting right atrial pressure of 3 mmHg.  6. Agitated saline contrast bubble study was negative, with no evidence of any interatrial shunt. Conclusion(s)/Recommendation(s): No intracardiac source of embolism detected on this transthoracic study. Consider a transesophageal echocardiogram to exclude cardiac source of embolism if clinically indicated. FINDINGS  Left Ventricle: Left ventricular ejection fraction, by estimation, is 60 to 65%. The left ventricle has normal function. The left ventricle has no regional wall motion abnormalities. The left ventricular internal cavity size was normal in size. There is  no left ventricular hypertrophy. Left ventricular diastolic parameters were normal. Normal left ventricular filling pressure. Right Ventricle: The right ventricular size  is normal. No increase in right ventricular wall thickness. Right ventricular systolic function is normal. Tricuspid regurgitation signal is inadequate for assessing PA pressure. Left Atrium: Left atrial size was normal in size. Right Atrium: Right atrial size was normal in size. Pericardium: There is no evidence of pericardial effusion. Mitral Valve: The mitral valve is normal in structure. Mild mitral annular calcification. Trivial mitral valve regurgitation. No evidence of mitral valve stenosis. Tricuspid Valve: The tricuspid valve is normal in structure. Tricuspid valve regurgitation is trivial. No evidence of tricuspid stenosis. Aortic Valve: The aortic valve is normal in structure. Aortic valve regurgitation is not visualized. No aortic stenosis is present. Aortic valve peak gradient measures 6.2 mmHg. Pulmonic Valve: The pulmonic valve was normal in structure. Pulmonic valve regurgitation is trivial. No evidence of pulmonic stenosis. Aorta: The aortic root is normal in size and structure. Venous: The inferior vena cava is normal in size with greater than 50% respiratory variability, suggesting right atrial pressure of 3 mmHg. IAS/Shunts: No atrial level shunt detected by color flow Doppler. Agitated saline contrast was given intravenously to evaluate for intracardiac shunting. Agitated saline contrast bubble study was negative, with no evidence of any interatrial shunt.  LEFT VENTRICLE PLAX 2D LVIDd:  4.30 cm   Diastology LVIDs:         3.10 cm   LV e' medial:    12.00 cm/s LV PW:         1.20 cm   LV E/e' medial:  5.2 LV IVS:        1.00 cm   LV e' lateral:   9.03 cm/s LVOT diam:     1.90 cm   LV E/e' lateral: 6.9 LV SV:         51 LV SV Index:   26 LVOT Area:     2.84 cm  RIGHT VENTRICLE             IVC RV S prime:     13.90 cm/s  IVC diam: 1.60 cm TAPSE (M-mode): 1.8 cm LEFT ATRIUM             Index        RIGHT ATRIUM          Index LA diam:        4.10 cm 2.05 cm/m   RA Area:     8.34 cm LA Vol  (A2C):   50.1 ml 25.08 ml/m  RA Volume:   12.50 ml 6.26 ml/m LA Vol (A4C):   49.8 ml 24.93 ml/m LA Biplane Vol: 54.9 ml 27.49 ml/m  AORTIC VALVE AV Area (Vmax): 1.81 cm AV Vmax:        124.00 cm/s AV Peak Grad:   6.2 mmHg LVOT Vmax:      79.33 cm/s LVOT Vmean:     50.200 cm/s LVOT VTI:       0.180 m  AORTA Ao Root diam: 3.10 cm Ao Asc diam:  3.30 cm MITRAL VALVE MV Area (PHT): 4.21 cm    SHUNTS MV Decel Time: 180 msec    Systemic VTI:  0.18 m MV E velocity: 62.70 cm/s  Systemic Diam: 1.90 cm MV A velocity: 48.10 cm/s MV E/A ratio:  1.30 Armanda Magic MD Electronically signed by Armanda Magic MD Signature Date/Time: 09/25/2022/12:55:16 PM    Final    MR Cervical Spine W and Wo Contrast  Result Date: 09/24/2022 CLINICAL DATA:  Left upper extremity and lower extremity numbness EXAM: MRI CERVICAL SPINE WITHOUT AND WITH CONTRAST TECHNIQUE: Multiplanar and multiecho pulse sequences of the cervical spine, to include the craniocervical junction and cervicothoracic junction, were obtained without and with intravenous contrast. CONTRAST:  10mL GADAVIST GADOBUTROL 1 MMOL/ML IV SOLN COMPARISON:  MRI cervical spine March 24, 2022 FINDINGS: Alignment: Straightening.  No substantial sagittal subluxation. Vertebrae: No fracture, evidence of discitis, or bone lesion. No abnormal enhancement. Cord: Normal cord signal.  No abnormal enhancement. Posterior Fossa, vertebral arteries, paraspinal tissues: Visualized vertebral artery flow voids are maintained. Disc levels: C2-C3: No significant disc protrusion, foraminal stenosis, or canal stenosis. C3-C4: No significant disc protrusion, foraminal stenosis, or canal stenosis. C4-C5: No significant disc protrusion, foraminal stenosis, or canal stenosis. C5-C6: No significant disc protrusion, foraminal stenosis, or canal stenosis. C6-C7: Similar mild to moderate left foraminal stenosis due to left foraminal disc protrusion. Patent canal and right foramen. C7-T1: No significant disc  protrusion, foraminal stenosis, or canal stenosis. IMPRESSION: Similar mild to moderate left foraminal stenosis C6-C7 due to left foraminal disc protrusion. Electronically Signed   By: Feliberto Harts M.D.   On: 09/24/2022 10:45   MR Brain W and Wo Contrast  Result Date: 09/24/2022 CLINICAL DATA:  Left upper and lower extremity numbness. Some difficulty walking. Abnormal CT exam.  EXAM: MRI HEAD WITHOUT AND WITH CONTRAST TECHNIQUE: Multiplanar, multiecho pulse sequences of the brain and surrounding structures were obtained without and with intravenous contrast. CONTRAST:  10mL GADAVIST GADOBUTROL 1 MMOL/ML IV SOLN COMPARISON:  CT studies earlier same day.  MRI 10/05/2008. FINDINGS: Brain: Diffusion imaging shows a punctate acute infarction in the lateral right thalamus, quite likely responsible for the presenting symptoms. No abnormality affects the brainstem or cerebellum otherwise. Cerebral hemispheres show a few scattered foci of T2 and FLAIR signal within the white matter consistent with minimal small vessel change, progressive since 2010. Small hyperdensity seen in the right centrum semiovale region shows susceptibility artifact with mild surrounding T2 and FLAIR signal. In 2010, there was a tiny T2 abnormality in this region. 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 I cannot completely rule out some acute hemorrhage or thrombotic change in this region presently, but think that the clinical presentation could be explained entirely by the right thalamic infarction, and a second acute abnormality would be quite coincidental. No cortical abnormality. No hydrocephalus or extra-axial collection. Vascular: Major vessels at the base of the brain show flow. Skull and upper cervical spine: Negative Sinuses/Orbits: Clear/normal Other: None IMPRESSION: 1. 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 2. Minimal small-vessel change of the cerebral hemispheric white matter, progressive since 2010. 3. Small hyperdensity seen in the right centrum semiovale region by CT shows susceptibility artifact with mild surrounding T2 and FLAIR signal. In 2010, there was a tiny T2 abnormality in this region. 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 I cannot completely rule out some acute hemorrhage or thrombotic change in this region presently, but think that the clinical presentation could be explained entirely by the right thalamic infarction, and a second acute abnormality would be quite coincidental. Electronically Signed   By: Paulina Fusi M.D.   On: 09/24/2022 10:37   DG Chest Portable 1 View  Result Date: 09/24/2022 CLINICAL DATA:  Stroke workup EXAM: PORTABLE CHEST 1 VIEW COMPARISON:  12/22/2021 FINDINGS: Artifact from EKG leads. Normal heart size and mediastinal contours for technique. No acute infiltrate or edema. No effusion or pneumothorax. No acute osseous findings. IMPRESSION: No active disease. Electronically Signed   By: Tiburcio Pea M.D.   On: 09/24/2022 07:32   CT ANGIO HEAD NECK W WO CM  Result Date: 09/24/2022 CLINICAL DATA:  Left arm numbness beginning 10 hours ago now with left arm and leg numbness beginning 2 hours ago. EXAM: CT ANGIOGRAPHY HEAD AND NECK WITH AND WITHOUT CONTRAST TECHNIQUE: Multidetector CT imaging of the head and neck was performed using the standard protocol during bolus administration of intravenous contrast. Multiplanar CT image reconstructions and MIPs were obtained to evaluate the vascular anatomy. Carotid stenosis measurements (when applicable) are obtained utilizing NASCET criteria, using the distal internal carotid diameter as the denominator. RADIATION DOSE REDUCTION: This exam was performed according to the  departmental dose-optimization program which includes automated exposure control, adjustment of the mA and/or kV according to patient size and/or use of iterative reconstruction technique. CONTRAST:  75mL OMNIPAQUE IOHEXOL 350 MG/ML SOLN COMPARISON:  None Available. FINDINGS: CT HEAD FINDINGS Brain: A 4 mm high-density areas subtly seen in the right centrum semiovale, most convincing on reformats. No evidence of gray matter infarct,  hydrocephalus, or collection. Vascular: No hyperdense vessel or unexpected calcification. Skull: Normal. Negative for fracture or focal lesion. Sinuses/Orbits: No acute finding. Review of the MIP images confirms the above findings CTA NECK FINDINGS Aortic arch: 3 vessel branching. Right carotid system: Vessels are smoothly contoured and widely patent. No atheromatous changes noted peer Left carotid system: Vessels are smoothly contoured and widely patent. Vertebral arteries: The subclavian and vertebral arteries are smoothly contoured and widely patent. No branch occlusion, beading, or aneurysm. Skeleton: No acute or aggressive finding Other neck: Negative Upper chest: Clear apical lungs Review of the MIP images confirms the above findings CTA HEAD FINDINGS Anterior circulation: No significant stenosis, proximal occlusion, aneurysm, or vascular malformation. Posterior circulation: No significant stenosis, proximal occlusion, aneurysm, or vascular malformation. Venous sinuses: Diffusely patent Anatomic variants: None significant Review of the MIP images confirms the above findings IMPRESSION: 1. 4 mm high-density area in the right centrum semiovale small bleed, calcification, or cavernoma could give this appearance. Especially given the symptoms, recommend brain MRI with contrast. 2. Negative CTA of the head and neck. Electronically Signed   By: Tiburcio Pea M.D.   On: 09/24/2022 07:12        Additional notes: Initial Appointment Goals:  This initial visit focused on establishing  a foundation for the patient's care. We collaboratively reviewed her medical history and medications in detail, updating the chart as shown in the encounter. Given the extensive information, we prioritized addressing her most pressing concerns, which she reported were: New Patient (Initial Visit)  While the complexity of the patient's medical picture may necessitate further evaluation in subsequent visits, we were able to develop a preliminary care plan together. To expedite a comprehensive plan at the next visit, we encouraged the patient to gather relevant medical records from previous providers. This collaborative approach will ensure a more complete understanding of the patient's health and inform the development of a personalized care plan. We look forward to continuing the conversation and working together with the patient on achieving her health goals.   Collaborative Documentation:  Today's encounter utilized real-time, dynamic patient engagement.  Patients actively participate by directly reviewing and assisting in updating their medical records through a shared screen. This transparency empowers patients to visually confirm chart updates made by the healthcare provider.  This collaborative approach facilitates problem management as we jointly update the problem list, problem overview, and assessment/plan. Ultimately, this process enhances chart accuracy and completeness, fostering shared decision-making, patient education, and informed consent for tests and treatments.  Collaborative Treatment Planning:  Treatment plans were discussed and reviewed in detail.  Explained medication safety and potential side effects.  Encouraged participation and answered all patient questions, confirming understanding and comfort with the plan. Encouraged patient to contact our office if they have any questions or concerns. Agreed on patient returning to office if symptoms worsen, persist, or new symptoms develop.  Discussed precautions in case of needing to visit the Emergency Department.  ----------------------------------------------------- Lula Olszewski, MD  11/14/2022 7:26 PM  [facility]:  [phone]

## 2022-11-14 NOTE — Patient Instructions (Signed)
Welcome aboard!   Today's visit was a valuable first step in understanding your health and starting your personalized care journey. We discussed your medical history and medications in detail. Given the extensive information, we prioritized addressing your most pressing concerns.  We understood those concerns to be:  New Patient (Initial Visit)   VISIT SUMMARY:  During your recent visit, we discussed your ongoing concerns about vertigo and blood pressure management following your stroke in April. We also reviewed your history of multiple surgeries, weight management, and elevated liver enzymes. Your primary concern was the onset of vertigo, which has been severe at times. You also reported persistent pain in your right elbow and both ankles following a fall. We discussed your history of sleep apnea, elevated blood pressure, and family history of diabetes. You are currently managing your blood glucose levels with diet and medication, and you are taking atorvastatin for elevated cholesterol.  YOUR PLAN:  -RECENT STROKE: Your stroke in April was likely due to high blood pressure and weight issues. We will continue your daily aspirin, order a repeat MRI to check for any new issues related to your vertigo, and consider more aggressive management of your cholesterol to prevent another stroke.  -VERTIGO: Your vertigo could be due to issues in your inner ear or brain. We will order an MRI to check for any problems in your brain, refer you to a specialist for balance and dizziness issues, and prescribe Antivert to help manage your symptoms.  -HIGH CHOLESTEROL: Your recent tests show high levels of LDL and total cholesterol. We will continue your Lipitor medication, encourage you to eat a Mediterranean diet with more avocados, and check your cholesterol levels again on July 25th.  -HIGH BLOOD PRESSURE: Your blood pressure was high at this visit. We will increase your amlodipine dose to help manage your blood  pressure and consider adding another medication if you develop swelling in your lower legs.  -POSSIBLE ATRIAL FIBRILLATION: Your weight issues and sleep apnea could lead to a heart rhythm problem called atrial fibrillation that would put you at ongoing risk for stroke if not detected. We will encourage you to watch for symptoms of this condition using a smart watch, and continue your current sleep apnea treatment.  INSTRUCTIONS:  Your next appointment is scheduled for July 25th for a cholesterol and A1c check. We may need to see you earlier to discuss the results of your MRI. Please continue to take your medications as prescribed and monitor your symptoms. If you have any concerns or if your symptoms worsen, please contact us immediately.  Building a Complete Picture  To create the most effective care plan possible, we may need additional information from previous providers. We encouraged you to gather any relevant medical records for your next visit. This will help [place] build a more complete picture and develop a personalized plan together. In the meantime, we'll address your immediate concerns and provide resources to help you manage all of your medical issues.  We encourage you to use MyChart to review these efforts, and to help [place] find and correct any omissions or errors in your medical chart.  Managing Your Health Over Time  Managing every aspect of your health in a single visit isn't always feasible, but that's okay.  We addressed your most pressing concerns today and charted a course for future care. Acute conditions or preventive care measures may require further attention.  We encourage you to schedule a follow-up visit at your earliest convenience to  discuss any unresolved issues.  We strongly encourage participation in annual preventive care visits to help [place] develop a more thorough understanding of your health and to help you maintain optimal wellness - please inquire about scheduling your  next one with [place] at your earliest convenience.  Your Satisfaction Matters  It was a pleasure seeing you today!  Your health and satisfaction will always be my top priorities. If you believe your experience today was worthy of a 5-star rating, I'd be grateful for your feedback!  Lula Olszewski, MD   Next Steps  Schedule Follow-Up:  We recommend a follow-up appointment in No follow-ups on file. If your condition worsens before then, please call us or seek emergency care. Preventive Care:  Don't forget to schedule your annual preventive care visit!  This important checkup is typically covered by insurance and helps identify potential health issues early.  Typically its 100% insurance covered with no co-pay and helps to get surveillance labwork paid for through your insurance provider.  Sometimes it even lowers your insurance premiums to participate. Medical Information Release:  For any relevant medical information we don't have, please sign a release form so we can obtain it for your records. Lab & X-ray Appointments:  Scheduled any incomplete lab tests today or call us to schedule.  X-Rays can be done without an appointment at [facility] at [facility] ([address]), M-F 8:30am-noon or 1pm-5pm.  Just tell them you're there for X-rays ordered by Dr. Jon Billings.  We'll receive the results and contact you by phone or MyChart to discuss next steps.  Bring to Your Next Appointment  Medications: Please bring all your medication bottles to your next appointment to ensure we have an accurate record of your prescriptions. Health Diaries: If you're monitoring any health conditions at home, keeping a diary of your readings can be very helpful for discussions at your next appointment.  Reviewing Your Records  Please Review this early draft of your clinical notes below and the final encounter summary tomorrow on MyChart after its been completed.   Osteoarthritis of spine with radiculopathy,  cervical region  Vertigo -     MR BRAIN W WO CONTRAST; Future -     Meclizine HCl; Take 1 tablet (25 mg total) by mouth 3 (three) times daily as needed for dizziness.  Dispense: 30 tablet; Refill: 1 -     PT Vestibular Evaluation; Future -     Ondansetron HCl; Take 1 tablet (4 mg total) by mouth every 8 (eight) hours as needed for nausea or vomiting.  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   OSA (obstructive sleep apnea)  Hypertension, unspecified type Assessment & Plan: To achieve the goal of lowering the patient's blood pressure to less than 130/80 mmHg, which is recommended post-stroke, we suggest considering an increase in the dose of amlodipine from 5 mg to 10 mg daily, after evaluating her tolerance to the current dose. Additionally, monitoring her blood pressure closely following this adjustment is crucial to assess the effectiveness and safety of the increased dosage. If this adjustment does not achieve the desired blood pressure control, further evaluation and possibly the addition of another antihypertensive agent may be necessary. This approach is aligned with the guidelines for the management  of hypertension in patients with a history of stroke to minimize the risk of recurrence.  Orders: -     amLODIPine Besylate; Take 1 tablet (10 mg total) by mouth daily.  Dispense: 90 tablet; Refill: 3 -     hydroCHLOROthiazide; Take 0.5 tablets (12.5 mg total) by mouth daily.  Dispense: 90 tablet; Refill: 3  Prediabetes Assessment & Plan: Encouraged patient to follow lifestyle.   Pain in joint of right ankle  Pain in joint of right elbow  Other specified abnormal findings of blood chemistry  Other  hyperlipidemia Assessment & Plan: Assessment: We reviewed the patient's recent lipid panel results and discussed their understanding of cholesterol and its role in cardiovascular disease (CVD).   We encouraged the patient to improve their lipid profile as much as possible.  Plan: Diet and Exercise: We collaboratively developed a plan to optimize diet and exercise habits for cholesterol management. Resources were provided to support the creation of a personalized plan.  Improving Your Cholesterol: Diet: Focus on a Mediterranean-style diet, limit saturated fats and sugars, and increase omega-3 fatty acids (fish, flaxseeds,nuts,extra virgin olive oil, avocados). Exercise: Engage in regular physical activity (aerobic exercises are particularly beneficial for HDL). Weight Management: Maintain a healthy weight. Smoking Cessation: never smoker  Routine monitoring:  We collaborated to decide on when to next check levels.  - July 25 we need and Hemoglobin A1c so follow up then Advanced Testing: We discussed the potential need for advanced testing beyond a basic lipid panel. This testing, which may include Lipoprotein(a), Apolipoprotein B (ApoB), and High-sensitivity C-reactive protein (hs-CRP), can provide a more detailed assessment of cardiovascular risk, especially in high cardiovascular risk situations (extreme lipid profiles, family history of cardiovascular disease at a young age, or personal history of diabetes, hypertension, obesity, heavy smoking)       Getting Answers and Following Up  Simple Questions & Concerns: For quick questions or basic follow-up after your visit, reach [place] at [phone] or MyChart messaging. Complex Concerns: If your concern is more complex, scheduling an appointment might be best. Discuss this with the staff to find the most suitable option. Lab & Imaging Results: We'll contact you directly if results are abnormal or you don't use MyChart. Most normal results will  be on MyChart within 2-3 business days, with a review message from Dr. Jon Billings. Haven't heard back in 2 weeks? Need results sooner? Contact us at [phone]. Referrals: Our referral coordinator will manage specialist referrals. The specialist's office should contact you within 2 weeks to schedule an appointment. Call us if you haven't heard from them after 2 weeks.  Staying Connected  MyChart: Activate your MyChart for the fastest way to access results and message [place]. See the last page of this paperwork for instructions.  Billing  X-ray & Lab Orders: These are billed by separate companies. Contact the invoicing company directly for questions or concerns. Visit Charges: Discuss any billing inquiries with our administrative services team.  Feedback & Satisfaction  Share Your Experience: We strive for your satisfaction! If you have any complaints, please let Dr. Jon Billings know directly or contact our Practice Administrators, Edwena Felty or [employer], by asking at the front desk.  Scheduling Tips  Shorter Wait Times: 8 am and 1 pm appointments often have the quickest wait times. Longer Appointments: If you need more time during your visit, talk to the front desk. Due to insurance regulations, multiple back-to-back appointments might be necessary.

## 2022-11-14 NOTE — Assessment & Plan Note (Signed)
Vertigo: She has been experiencing new onset vertigo for the past three weeks, which could be either peripheral (BPPV) or central (cerebellar). We will order an MRI to rule out cerebellar involvement, refer her to vestibular rehab, and prescribe Antivert (meclizine) for symptom management.

## 2022-11-14 NOTE — Assessment & Plan Note (Signed)
Follow-up: The next appointment is scheduled for July 25th for a cholesterol and A1c check. An earlier follow-up is possible for MRI results.

## 2022-11-25 ENCOUNTER — Ambulatory Visit
Admission: RE | Admit: 2022-11-25 | Discharge: 2022-11-25 | Disposition: A | Discharge status: Home / Self Care | Payer: PRIVATE HEALTH INSURANCE | Source: Ambulatory Visit | Attending: Internal Medicine

## 2022-11-25 DIAGNOSIS — R42 Dizziness and giddiness: Secondary | ICD-10-CM

## 2022-11-25 MED ORDER — GADOPICLENOL 0.5 MMOL/ML IV SOLN
10.0000 mL | Freq: Once | INTRAVENOUS | Status: AC | PRN
  Administered 2022-11-25: 10 mL via INTRAVENOUS

## 2022-11-27 ENCOUNTER — Encounter: Payer: Self-pay | Admitting: Internal Medicine

## 2022-11-28 NOTE — Progress Notes (Signed)
Dear Ms.Wolak,  This message is to follow up on your recent MRI scan and discuss your new vertigo symptoms.  My personal reading was more concerned for changes but radiologist felt your MRI was unchanged from prior, and he has better comparison tools than I do.  MRI Results per radiology:  The MRI scan showed no signs of new bleeding or major changes in your brain since your stroke. There is a small area of injury in the right thalamus, which is likely related to your previous stroke. We believe this area is likely the cause of your vertigo. Additionally, there are some other small areas of injury that appear to be related to long-term blood flow problems in the brain.  Recommendations:  Continue your current medications (aspirin 81mg  daily and atorvastatin) for stroke prevention. We recommend addressing your blood pressure management more aggressively to help prevent further strokes. Please watch it closely with home blood pressure readings. We recommend considering vestibular rehabilitation therapy to manage your vertigo symptoms. This type of therapy can be very effective in helping people recover from vertigo.  Referral was placed 11/13/22 you should have heard from them for scheduling by now. Schedule follow-up with neurology as planned. During this appointment, you can discuss your vertigo in more detail and develop a treatment plan. We will continue to monitor and manage other risk factors such as cholesterol and blood glucose levels. In the meantime, if you have any questions or concerns, please don't hesitate to contact our office.  Sincerely, Lula Olszewski, MD  11/28/2022 11:18 AM   [facility]:  [phone]

## 2022-12-01 ENCOUNTER — Other Ambulatory Visit: Payer: Self-pay

## 2022-12-01 DIAGNOSIS — I6381 Other cerebral infarction due to occlusion or stenosis of small artery: Secondary | ICD-10-CM

## 2022-12-01 DIAGNOSIS — R42 Dizziness and giddiness: Secondary | ICD-10-CM

## 2022-12-01 NOTE — Addendum Note (Signed)
Addended by: Donzetta Starch on: 12/01/2022 04:38 PM   Modules accepted: Orders

## 2022-12-02 NOTE — Progress Notes (Unsigned)
Patient: Samantha Castro Date of Birth: 30-Sep-1970  Reason for Visit: Stroke Clinic Visit  History from: Patient Primary Neurologist: Pearlean Brownie   ASSESSMENT AND PLAN 52 y.o. year old female presented with left upper and lower extremity numbness.  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  Vascular risk factors of hypertension, hyperlipidemia, obesity.  Left-sided paresthesia has essentially resolved, but may experience tingling to the left leg with prolonged use.  She had a flare of vertigo a few weeks ago that is essentially resolved.  She is awaiting vestibular rehab.  -Continue aspirin 81 mg daily for secondary stroke prevention -Continue to work on weight loss at weight loss clinic.  Encouraged to continue working on healthy eating, exercise -Strict management of vascular risk factors with a goal BP less than 130/90, A1c less than 7.0, LDL less than 70 for secondary stroke prevention -Does not appear to be much change from April and June 2024 MRI brain, likely no further monitoring of suspected venous angioma, but will kindly ask Dr.Sethi to weigh in since patient is concerned  -Keep close follow-up with primary care doctor, return here as needed  HISTORY OF PRESENT ILLNESS: Today 12/03/22 Samantha Castro here today for follow-up.  Presented 09/24/2022 with left upper and lower extremity numbness.  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  Likely due to small vessel disease.  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  LDL was elevated 139, atorvastatin 40 was added. She is on CPAP. Reported vertigo to her PCP, MRI was stable, no new infarct (see below). Ordered vestibular rehab, didn't get a chance to go yet.  Vertigo was present non-stop for 3 weeks, was vomiting. Is now significantly better.   The left arm is back to normal. The left leg may still tingle, gets tired easy mostly with prolonged activity or use. Her left foot burns.  Still on aspirin 81 mg daily. On Lipitor 40 mg daily since stroke LDL 139. A1C 5.9, working with weight loss clinic. We talked about weight loss drugs, has been prescribed Zepbound, didn't start it. Tried [place] but couldn't tolerate.  Works as a Engineer, civil (consulting) for [employer], [employer] population health working remotely.  She has close follow-up with her primary care doctor.  MRI brain 11/25/22 IMPRESSION: 1. No acute intracranial abnormality or significant interval change. 2. Punctate T2 hyperintensity in the right thalamus corresponding to the previously seen acute nonhemorrhagic infarct. 3. Periventricular and subcortical T2 hyperintensities are otherwise stable. This likely reflects the sequela of chronic microvascular ischemia.  -CT head 4 mm high density area in the right centrum semiovale small bleed, calcification, or cavernoma could give this appearance. -CTA head and neck no LVO -MRI of the brain punctate acute infarct in the lateral right thalamus -2D echo EF 60 to 65% -LDL 139 -A1c 5.9 -No antithrombotic prior to admission, 3 weeks aspirin 81 mg daily and Plavix 75 mg daily, then aspirin alone  HISTORY  09/24/22 Dr. Derry Lory HPI: Samantha Castro is a 52 y.o. female with PMH significant for GERd, endometriosis, HTN, nephrolithiasis who presents with LUE and LLE numbness. Left arm numb around 2000 on 09/23/22. Went to bed at 2200 and woke up at 0400 with LLE numbness now. Initially presented to [facility] ED and then transferred to [facility] where MRI Brain demonstrates a R thalamic stroke.   LKW: 2000 on 09/23/22. mRS: 0 tNKASE: not offered, too mild and outside window Thrombectomy: not offered, low suspicion for LVO.  REVIEW  OF SYSTEMS: Out of a complete 14 system review of symptoms, the patient complains only of the following symptoms, and all other reviewed systems are negative.  See HPI  ALLERGIES: Allergies  Allergen Reactions   Bacitracin-Polymyxin B Rash   Vancomycin Hives and Rash    Lisinopril Other (See Comments) and Cough    Unknown  Other Reaction(s): cough, Other (See Comments)  Cough  Unknown  Cough  Cough, Unknown   Wound Dressing Adhesive Rash    PAPER/SILK TAPE OK   Phentermine Hcl Other (See Comments)    HOME MEDICATIONS: Outpatient Medications Prior to Visit  Medication Sig Dispense Refill   amLODipine (NORVASC) 10 MG tablet Take 1 tablet (10 mg total) by mouth daily. 90 tablet 3   aspirin EC 81 MG tablet Take 1 tablet (81 mg total) by mouth daily. Swallow whole. 30 tablet 12   atorvastatin (LIPITOR) 40 MG tablet Take 1 tablet (40 mg total) by mouth daily. 30 tablet 2   Biotin 40981 MCG TABS Take 1 capsule by mouth daily.     diclofenac (VOLTAREN) 75 MG EC tablet Take 75 mg by mouth daily.     EPINEPHrine 0.3 mg/0.3 mL IJ SOAJ injection Use contents of one pen to inject into the muslce once as needed for life threatening reaction. May repeat second time after first seeking emergency medical attention. for 30 days     gabapentin (NEURONTIN) 100 MG capsule Take by mouth as needed. 1-2 at night for hot flashes.     hydrochlorothiazide (HYDRODIURIL) 25 MG tablet Take 0.5 tablets (12.5 mg total) by mouth daily. 90 tablet 3   hydrOXYzine (ATARAX/VISTARIL) 10 MG tablet Take 10 mg by mouth continuous as needed for itching or anxiety (Use as needed for flare).     linaclotide (LINZESS) 290 MCG CAPS capsule Take 1 capsule (290 mcg total) by mouth daily before breakfast. 90 capsule 0   meclizine (ANTIVERT) 25 MG tablet Take 1 tablet (25 mg total) by mouth 3 (three) times daily as needed for dizziness. 30 tablet 1   Meth-Hyo-M Bl-Na Phos-Ph Sal (URIBEL) 118 MG CAPS Take 118 mg by mouth continuous as needed (Only as needed for flare).     Misc. Devices MISC Initiate CPAP therapy at 10 cm. water pressure with EPR 3.  CPAP machine, mask and supplies for OSA.  F&P Eson II nasal mask or patient preference.  Send to [place].     nitrofurantoin (MACRODANTIN) 100 MG capsule  Take 100 mg by mouth 4 (four) times daily. As needed.     ondansetron (ZOFRAN) 4 MG tablet Take 1 tablet (4 mg total) by mouth every 8 (eight) hours as needed for nausea or vomiting. 20 tablet 0   OVER THE COUNTER MEDICATION daily. Turbo Power Vitamin/Super Immunity     oxybutynin (DITROPAN) 5 MG tablet Take 5 mg by mouth daily as needed for bladder spasms.     pantoprazole (PROTONIX) 40 MG tablet Take 40 mg by mouth daily.     tiZANidine (ZANAFLEX) 4 MG tablet Take 4 mg by mouth every 6 (six) hours as needed for muscle spasms.     UNABLE TO FIND in the morning and at bedtime. Omega 7 complete.     valsartan (DIOVAN) 160 MG tablet Take by mouth daily.     VITAMIN D PO Take by mouth daily. 5000 units .     tirzepatide (ZEPBOUND) 2.5 MG/0.5ML Pen Inject 2.5mg  under the skin once weekly. for 90 days (Patient not taking: Reported  on 12/03/2022)     tirzepatide (ZEPBOUND) 5 MG/0.5ML Pen Inject 5mg  under the skin once weekly. for 90 days (Patient not taking: Reported on 12/03/2022)     Biotin 10 MG CAPS Take by mouth. (Patient not taking: Reported on 11/13/2022)     celecoxib (CELEBREX) 200 MG capsule Take 200 mg by mouth 2 (two) times daily as needed. (Patient not taking: Reported on 11/13/2022)     diclofenac (VOLTAREN) 50 MG EC tablet Take 50 mg by mouth 2 (two) times daily as needed. (Patient not taking: Reported on 11/13/2022)     No facility-administered medications prior to visit.    PAST MEDICAL HISTORY: Past Medical History:  Diagnosis Date   Allergic rhinitis    Anemia    Asthma    Back pain    Blood transfusion without reported diagnosis    Cancer (HCC)    Constipation    Endometriosis    GERD (gastroesophageal reflux disease)    History of kidney stones    History of melanoma    Hypertension    Interstitial cystitis    Lower extremity edema    PONV (postoperative nausea and vomiting)    Sleep apnea    Stroke [facility])    Thalamic stroke (HCC) 11/13/2022   09/2022 MRI 1. Punctate  acute infarction in the lateral right thalamus, quite likely responsible for the presenting symptoms. 2. Minimal small-vessel change of the cerebral hemispheric white matter, progressive since 2010. 3. Small hyperdensity seen in the right centrum semiovale region by    PAST SURGICAL HISTORY: Past Surgical History:  Procedure Laterality Date   ABDOMINAL HYSTERECTOMY     APPENDECTOMY     ARTHROSCOPIC REPAIR ACL     COLON RESECTION     COLON SURGERY     EXCISION MELANOMA WITH SENTINEL LYMPH NODE BIOPSY Right 12/15/2016   Procedure: WIDE EXCISION RIGHT WRIST  MELANOMA WITH RIGHT SENTINEL NODE MAPPING;  Surgeon: Harriette Bouillon, MD;  Location: [facility];  Service: General;  Laterality: Right;   IRRIGATION AND DEBRIDEMENT ABSCESS Right 12/28/2016   Procedure: IRRIGATION AND DEBRIDEMENT AXILLARY ABSCESS;  Surgeon: Manus Rudd, MD;  Location: [facility] OR;  Service: General;  Laterality: Right;   MELANOMA EXCISION     PELVIC LAPAROSCOPY      FAMILY HISTORY: Family History  Problem Relation Age of Onset   Cancer Mother    Stroke Mother    Lung cancer Mother 29       deceased 46   Hypertension Mother    Colon cancer Mother        dx 59s   Lung cancer Father 43       deceased 64   Heart disease Father    Hypertension Father    Cancer Father    Lung cancer Maternal Uncle    Lung cancer Maternal Uncle    Pancreatic cancer Maternal Uncle    Breast cancer Paternal Aunt        dx 10s; currently 25s   Breast cancer Paternal Grandmother 64       deceased 90s   Heart disease Paternal Grandmother    Melanoma Cousin        deceased 33    SOCIAL HISTORY: Social History   Socioeconomic History   Marital status: Married    Spouse name: Not on file   Number of children: Not on file   Years of education: Not on file   Highest education level: Not on file  Occupational History  Occupation: Charity fundraiser  Tobacco Use   Smoking status: Never   Smokeless tobacco: Never  Vaping Use    Vaping Use: Never used  Substance and Sexual Activity   Alcohol use: No   Drug use: No   Sexual activity: Yes    Birth control/protection: Other-see comments    Comment: hysterectomy  Other Topics Concern   Not on file  Social History Narrative   Not on file   Social Determinants of Health   Financial Resource Strain: Not on file  Food Insecurity: No Food Insecurity (09/24/2022)   Hunger Vital Sign    Worried About Running Out of Food in the Last Year: Never true    Ran Out of Food in the Last Year: Never true  Transportation Needs: No Transportation Needs (09/24/2022)   PRAPARE - Administrator, Civil Service (Medical): No    Lack of Transportation (Non-Medical): No  Physical Activity: Not on file  Stress: Not on file  Social Connections: Not on file  Intimate Partner Violence: Not At Risk (09/24/2022)   Humiliation, Afraid, Rape, and Kick questionnaire    Fear of Current or Ex-Partner: No    Emotionally Abused: No    Physically Abused: No    Sexually Abused: No   PHYSICAL EXAM  Vitals:   12/03/22 0749  BP: 135/81  Pulse: 75  Weight: 221 lb (100.2 kg)  Height: 5\' 4"  (1.626 m)   Body mass index is 37.93 kg/m.  Generalized: Well developed, in no acute distress  Neurological examination  Mentation: Alert oriented to time, place, history taking. Follows all commands speech and language fluent Cranial nerve II-XII: Pupils were equal round reactive to light. Extraocular movements were full, visual field were full on confrontational test. Facial sensation and strength were normal.  Head turning and shoulder shrug  were normal and symmetric. Motor: The motor testing reveals 5 over 5 strength of all 4 extremities. Good symmetric motor tone is noted throughout.  Sensory: Sensory testing is intact to soft touch on all 4 extremities. No evidence of extinction is noted.  Coordination: Cerebellar testing reveals good finger-nose-finger and heel-to-shin bilaterally.   Gait and station: Gait is normal. Tandem gait is normal. Romberg is negative. No drift is seen.  Reflexes: Deep tendon reflexes are symmetric and normal bilaterally.   DIAGNOSTIC DATA (LABS, IMAGING, TESTING) - I reviewed patient records, labs, notes, testing and imaging myself where available.  Lab Results  Component Value Date   WBC 7.5 09/25/2022   HGB 13.0 09/25/2022   HCT 37.8 09/25/2022   MCV 88.3 09/25/2022   PLT 266 09/25/2022      Component Value Date/Time   NA 138 09/25/2022 0447   NA 139 02/15/2021 0000   K 3.7 09/25/2022 0447   CL 107 09/25/2022 0447   CO2 25 09/25/2022 0447   GLUCOSE 119 (H) 09/25/2022 0447   BUN 25 (H) 09/25/2022 0447   BUN 20 02/15/2021 0000   CREATININE 0.99 09/25/2022 0447   CALCIUM 8.8 (L) 09/25/2022 0447   PROT 7.6 09/24/2022 0609   PROT 7.5 02/15/2021 0000   ALBUMIN 4.0 09/24/2022 0609   ALBUMIN 4.7 02/15/2021 0000   AST 20 09/24/2022 0609   ALT 24 09/24/2022 0609   ALKPHOS 77 09/24/2022 0609   BILITOT 0.5 09/24/2022 0609   BILITOT 0.3 02/15/2021 0000   GFRNONAA >60 09/25/2022 0447   GFRAA >60 05/03/2018 2204   Lab Results  Component Value Date   CHOL 212 (H) 02/15/2021  HDL 58 02/15/2021   LDLCALC 139 (H) 02/15/2021   TRIG 85 02/15/2021   CHOLHDL 3.7 02/15/2021   Lab Results  Component Value Date   HGBA1C 5.9 (H) 09/25/2022   Lab Results  Component Value Date   VITAMINB12 871 02/15/2021   Lab Results  Component Value Date   TSH 1.360 02/15/2021    Margie Ege, AGNP-C, DNP 12/03/2022, 4:24 PM [facility] [address] [place], [place] [address] [phone]

## 2022-12-03 ENCOUNTER — Encounter: Payer: Self-pay | Admitting: Neurology

## 2022-12-03 ENCOUNTER — Other Ambulatory Visit: Payer: Self-pay

## 2022-12-03 ENCOUNTER — Ambulatory Visit: Payer: PRIVATE HEALTH INSURANCE | Admitting: Neurology

## 2022-12-03 VITALS — BP 135/81 | HR 75 | Ht 64.0 in | Wt 221.0 lb

## 2022-12-03 DIAGNOSIS — R42 Dizziness and giddiness: Secondary | ICD-10-CM | POA: Diagnosis not present

## 2022-12-03 DIAGNOSIS — I159 Secondary hypertension, unspecified: Secondary | ICD-10-CM | POA: Diagnosis not present

## 2022-12-03 DIAGNOSIS — Q283 Other malformations of cerebral vessels: Secondary | ICD-10-CM

## 2022-12-03 DIAGNOSIS — I6381 Other cerebral infarction due to occlusion or stenosis of small artery: Secondary | ICD-10-CM

## 2022-12-03 NOTE — Patient Instructions (Signed)
Strict management of vascular risk factors with a goal BP less than 130/90, A1c less than 7.0, LDL less than 70 for secondary stroke prevention  Recommend vestibular rehab   Close follow up with PCP  Return here as needed

## 2022-12-05 ENCOUNTER — Encounter: Payer: Self-pay | Admitting: Internal Medicine

## 2022-12-11 ENCOUNTER — Other Ambulatory Visit: Payer: Self-pay | Admitting: Internal Medicine

## 2022-12-11 ENCOUNTER — Encounter: Payer: Self-pay | Admitting: Neurology

## 2022-12-19 ENCOUNTER — Ambulatory Visit: Payer: PRIVATE HEALTH INSURANCE | Admitting: Podiatry

## 2022-12-26 NOTE — Progress Notes (Deleted)
    Samantha Castro is a 52 y.o. female who presents to [facility] at [facility] today for ***   Pertinent review of systems: ***  Relevant historical information: ***   Exam:  There were no vitals taken for this visit. General: Well Developed, well nourished, and in no acute distress.   MSK: ***    Lab and Radiology Results No results found for this or any previous visit (from the past 72 hour(s)). No results found.     Assessment and Plan: 52 y.o. female with ***   PDMP not reviewed this encounter. No orders of the defined types were placed in this encounter.  No orders of the defined types were placed in this encounter.    Discussed warning signs or symptoms. Please see discharge instructions. Patient expresses understanding.   ***

## 2022-12-29 ENCOUNTER — Ambulatory Visit: Payer: PRIVATE HEALTH INSURANCE | Admitting: Family Medicine

## 2022-12-30 ENCOUNTER — Ambulatory Visit: Payer: PRIVATE HEALTH INSURANCE | Admitting: Family Medicine

## 2022-12-30 ENCOUNTER — Encounter: Payer: Self-pay | Admitting: Family Medicine

## 2022-12-30 ENCOUNTER — Other Ambulatory Visit: Payer: Self-pay

## 2022-12-30 ENCOUNTER — Ambulatory Visit (INDEPENDENT_AMBULATORY_CARE_PROVIDER_SITE_OTHER): Payer: PRIVATE HEALTH INSURANCE

## 2022-12-30 VITALS — BP 110/80 | HR 77 | Ht 64.0 in | Wt 220.0 lb

## 2022-12-30 DIAGNOSIS — M79672 Pain in left foot: Secondary | ICD-10-CM

## 2022-12-30 NOTE — Patient Instructions (Addendum)
Thank you for coming in today.   Offload the pressure by modifying your insoles.   Recheck in 1 month.   Please get an Xray today before you leave   I recommend you obtained a compression sleeve to help with your joint problems. There are many options on the market however I recommend obtaining a full ankle Body Helix compression sleeve.  You can find information (including how to appropriate measure yourself for sizing) can be found at www.Body [email or web address].ch.  Many of these products are health savings account (HSA) eligible.   You can use the compression sleeve at any time throughout the day but is most important to use while being active as well as for 2 hours post-activity.   It is appropriate to ice following activity with the compression sleeve in place.

## 2022-12-30 NOTE — Progress Notes (Unsigned)
    I, Stevenson Clinch, CMA acting as a scribe for Clementeen Graham, MD.  Samantha Castro is a 52 y.o. female who presents to [facility] at [facility] today for left foot pain. Sx started in April after developing n/t in the extremities after having a stroke. Feeling in the foot is slowly starting to come back but continues to have a burning sensation in the foot. Redness and swelling present. Has tried changing shoes, insoles, heel cup, nighttime bracing.    Pertinent review of systems: No fevers or chills  Relevant historical information: History of stroke   Exam:  BP 110/80   Pulse 77   Ht 5\' 4"  (1.626 m)   Wt 220 lb (99.8 kg)   SpO2 97%   BMI 37.76 kg/m  General: Well Developed, well nourished, and in no acute distress.   MSK: Left foot normal appearing Palpable subcutaneous mass medial midfoot. Normal foot and ankle motion. Tender palpation medial to plantar midfoot.    Lab and Radiology Results  Diagnostic Limited MSK Ultrasound of: Left midfoot Palpable mass visualized and is consistent with surrounding subcutaneous tissue.  Not typical in appearance with plantar fibroma.  No vascular activity. Impression: Likely variation of normal foot structure versus lipoma versus possible plantar fibroma   X-ray images left foot obtained today personally and independently interpreted No significant arthritis.  No acute fractures. Await formal radiology review  Assessment and Plan: 52 y.o. female with left midfoot pain.  Patient had a stroke and had decreased sensitivity to the left side.  That is improving but there is still some lack of full sensitivity into the foot.  She is tender in the region where there is a bit of a nodule.  I think this may be just normal anatomical variation but there could be a lipoma or plantar fibroma as well.  We discussed options.  Plan for trial of offloading pressure by modifying insoles.  Recommend also Voltaren gel.  Check back  in 1 month.  If not better consider advanced imaging such as MRI.   PDMP not reviewed this encounter. Orders Placed This Encounter  Procedures   [place] LIMITED JOINT SPACE STRUCTURES LOW LEFT(NO LINKED CHARGES)    Order Specific Question:   Reason for Exam (SYMPTOM  OR DIAGNOSIS REQUIRED)    Answer:   left foot pain    Order Specific Question:   Preferred imaging location?    Answer:   [facility] Foot Complete Left    Standing Status:   Future    Number of Occurrences:   1    Standing Expiration Date:   12/30/2023    Order Specific Question:   Reason for Exam (SYMPTOM  OR DIAGNOSIS REQUIRED)    Answer:   left foot    Order Specific Question:   Preferred imaging location?    Answer:   [facility]    Order Specific Question:   Is patient pregnant?    Answer:   No   No orders of the defined types were placed in this encounter.    Discussed warning signs or symptoms. Please see discharge instructions. Patient expresses understanding.   The above documentation has been reviewed and is accurate and complete Clementeen Graham, M.D.

## 2022-12-31 ENCOUNTER — Encounter (INDEPENDENT_AMBULATORY_CARE_PROVIDER_SITE_OTHER): Payer: Self-pay

## 2022-12-31 ENCOUNTER — Ambulatory Visit: Payer: PRIVATE HEALTH INSURANCE | Admitting: Internal Medicine

## 2022-12-31 ENCOUNTER — Encounter: Payer: Self-pay | Admitting: Internal Medicine

## 2022-12-31 DIAGNOSIS — M79672 Pain in left foot: Secondary | ICD-10-CM | POA: Insufficient documentation

## 2023-01-06 NOTE — Progress Notes (Signed)
Left foot x-ray looks okay to radiology.  No broken bones are visible.  No severe arthritis is visible.

## 2023-01-08 ENCOUNTER — Ambulatory Visit: Payer: PRIVATE HEALTH INSURANCE | Admitting: Internal Medicine

## 2023-01-12 ENCOUNTER — Encounter: Payer: Self-pay | Admitting: Internal Medicine

## 2023-01-12 ENCOUNTER — Ambulatory Visit: Payer: PRIVATE HEALTH INSURANCE | Admitting: Internal Medicine

## 2023-01-12 DIAGNOSIS — R42 Dizziness and giddiness: Secondary | ICD-10-CM

## 2023-01-13 NOTE — Telephone Encounter (Signed)
Due to delay in hearing about vestibular rehab referral, patient requests ENT referral.    I responded with:  I'm sorry to hear that you're still experiencing frequent vertigo symptoms. I'm taking the following actions immediately:  1. Investigating what happened with your vestibular rehab referral. I'll work with our referral team to expedite this process and get you an appointment as soon as possible.  2. Placing a referral to [facility] ENT for Dr. Cheron Schaumann on 01/26/23 at 8:45 am, as you requested. The referral will include the diagnosis of vertigo to ensure they can fully address your concerns.  I'll update you as soon as I have more information about these referrals. If you don't hear back from me within 2 business days, please don't hesitate to reach out.  Take care, Lula Olszewski, MD  01/13/2023 8:44 PM   [facility]:  [phone]     While we're working on getting you seen by specialists, here are some strategies to help manage your vertigo symptoms:  Medications:  Continue taking meclizine as prescribed for dizziness. Use ondansetron as needed for nausea or vomiting. Always take these medications as directed and inform us of any side effects.  Home Remedies: The Epley Maneuver: This can help if your vertigo is due to BPPV. Look up "Epley Maneuver" online for instructions, but be cautious and stop if it worsens symptoms. Stay hydrated and avoid caffeine, alcohol, and tobacco, which can worsen symptoms. Get enough sleep and manage stress, as these can impact vertigo.  Safety Precautions: Move slowly when changing positions, especially when getting up from lying down or sitting. Use nightlights and remove tripping hazards in your home. Consider using a cane or walker for stability if needed.   Symptom Tracking: Keep a diary of your vertigo episodes, noting duration, severity, and any potential triggers. This information will be  valuable for your upcoming appointments.   Dietary Considerations: Some people find that reducing salt intake helps with vertigo. Eat regular, balanced meals to maintain stable blood sugar levels.   Vestibular Rehabilitation Exercises: While waiting for your appointment, you can try gentle head rotation exercises: Sit comfortably and slowly turn your head from side to side. Do this for 30 seconds, rest, and repeat a few times daily.   Stop immediately if these cause severe dizziness or other symptoms.  Blood Pressure Management: Continue monitoring your blood pressure at home. Take your blood pressure medications as prescribed. Report any consistently high readings to [place].   Stroke Prevention: Continue taking your aspirin 81mg  daily as prescribed. Maintain your CPAP use for sleep apnea management.  Remember, if you experience severe, prolonged, or new symptoms, particularly those similar to your previous stroke (like numbness or weakness), seek emergency care immediately. These strategies are meant to help manage your symptoms until we can get you fully evaluated. They're not a substitute for professional medical care. Please attend your upcoming appointments and keep [place] informed of any significant changes in your condition. Take care, and don't hesitate to reach out if you have any questions or concerns.

## 2023-01-28 ENCOUNTER — Ambulatory Visit: Payer: PRIVATE HEALTH INSURANCE | Admitting: Internal Medicine

## 2023-02-04 ENCOUNTER — Ambulatory Visit: Payer: PRIVATE HEALTH INSURANCE | Admitting: Family Medicine

## 2023-02-20 ENCOUNTER — Ambulatory Visit: Payer: PRIVATE HEALTH INSURANCE | Admitting: Internal Medicine

## 2023-03-13 ENCOUNTER — Ambulatory Visit: Payer: PRIVATE HEALTH INSURANCE | Admitting: Internal Medicine

## 2023-08-12 ENCOUNTER — Other Ambulatory Visit: Payer: Self-pay

## 2023-08-12 ENCOUNTER — Emergency Department (HOSPITAL_BASED_OUTPATIENT_CLINIC_OR_DEPARTMENT_OTHER)
Admission: EM | Admit: 2023-08-12 | Discharge: 2023-08-12 | Disposition: A | Discharge status: Home / Self Care | Payer: PRIVATE HEALTH INSURANCE | Attending: Emergency Medicine | Admitting: Emergency Medicine

## 2023-08-12 ENCOUNTER — Other Ambulatory Visit (HOSPITAL_BASED_OUTPATIENT_CLINIC_OR_DEPARTMENT_OTHER): Payer: Self-pay

## 2023-08-12 ENCOUNTER — Encounter (HOSPITAL_BASED_OUTPATIENT_CLINIC_OR_DEPARTMENT_OTHER): Payer: Self-pay | Admitting: Emergency Medicine

## 2023-08-12 ENCOUNTER — Emergency Department (HOSPITAL_BASED_OUTPATIENT_CLINIC_OR_DEPARTMENT_OTHER): Payer: PRIVATE HEALTH INSURANCE

## 2023-08-12 DIAGNOSIS — R1013 Epigastric pain: Secondary | ICD-10-CM

## 2023-08-12 DIAGNOSIS — Z7982 Long term (current) use of aspirin: Secondary | ICD-10-CM | POA: Insufficient documentation

## 2023-08-12 DIAGNOSIS — Z79899 Other long term (current) drug therapy: Secondary | ICD-10-CM | POA: Insufficient documentation

## 2023-08-12 DIAGNOSIS — J45909 Unspecified asthma, uncomplicated: Secondary | ICD-10-CM | POA: Diagnosis not present

## 2023-08-12 DIAGNOSIS — R112 Nausea with vomiting, unspecified: Secondary | ICD-10-CM

## 2023-08-12 DIAGNOSIS — I10 Essential (primary) hypertension: Secondary | ICD-10-CM | POA: Insufficient documentation

## 2023-08-12 DIAGNOSIS — R109 Unspecified abdominal pain: Secondary | ICD-10-CM | POA: Diagnosis present

## 2023-08-12 LAB — CBC WITH DIFFERENTIAL/PLATELET
Abs Immature Granulocytes: 0.03 10*3/uL (ref 0.00–0.07)
Basophils Absolute: 0 10*3/uL (ref 0.0–0.1)
Basophils Relative: 0 %
Eosinophils Absolute: 0.1 10*3/uL (ref 0.0–0.5)
Eosinophils Relative: 2 %
HCT: 37.7 % (ref 36.0–46.0)
Hemoglobin: 13.3 g/dL (ref 12.0–15.0)
Immature Granulocytes: 0 %
Lymphocytes Relative: 21 %
Lymphs Abs: 1.7 10*3/uL (ref 0.7–4.0)
MCH: 31.1 pg (ref 26.0–34.0)
MCHC: 35.3 g/dL (ref 30.0–36.0)
MCV: 88.3 fL (ref 80.0–100.0)
Monocytes Absolute: 0.5 10*3/uL (ref 0.1–1.0)
Monocytes Relative: 6 %
Neutro Abs: 5.9 10*3/uL (ref 1.7–7.7)
Neutrophils Relative %: 71 %
Platelets: 292 10*3/uL (ref 150–400)
RBC: 4.27 MIL/uL (ref 3.87–5.11)
RDW: 13.7 % (ref 11.5–15.5)
WBC: 8.3 10*3/uL (ref 4.0–10.5)
nRBC: 0 % (ref 0.0–0.2)

## 2023-08-12 LAB — COMPREHENSIVE METABOLIC PANEL
ALT: 18 U/L (ref 0–44)
AST: 22 U/L (ref 15–41)
Albumin: 4 g/dL (ref 3.5–5.0)
Alkaline Phosphatase: 72 U/L (ref 38–126)
Anion gap: 10 (ref 5–15)
BUN: 17 mg/dL (ref 6–20)
CO2: 26 mmol/L (ref 22–32)
Calcium: 9.3 mg/dL (ref 8.9–10.3)
Chloride: 103 mmol/L (ref 98–111)
Creatinine, Ser: 0.99 mg/dL (ref 0.44–1.00)
GFR, Estimated: >60 mL/min (ref >60)
Glucose, Bld: 106 mg/dL — ABNORMAL HIGH (ref 70–99)
Potassium: 3.6 mmol/L (ref 3.5–5.1)
Sodium: 139 mmol/L (ref 135–145)
Total Bilirubin: 0.6 mg/dL (ref 0.0–1.2)
Total Protein: 7.6 g/dL (ref 6.5–8.1)

## 2023-08-12 LAB — LIPASE, BLOOD: Lipase: 25 U/L (ref 11–51)

## 2023-08-12 MED ORDER — MORPHINE SULFATE (PF) 4 MG/ML IV SOLN
4.0000 mg | Freq: Once | INTRAVENOUS | Status: DC
  Filled 2023-08-12: qty 1

## 2023-08-12 MED ORDER — ONDANSETRON HCL 4 MG/2ML IJ SOLN
4.0000 mg | Freq: Once | INTRAMUSCULAR | Status: AC
  Administered 2023-08-12: 4 mg via INTRAVENOUS
  Filled 2023-08-12: qty 2

## 2023-08-12 MED ORDER — IOHEXOL 300 MG/ML  SOLN
100.0000 mL | Freq: Once | INTRAMUSCULAR | Status: AC | PRN
  Administered 2023-08-12: 100 mL via INTRAVENOUS

## 2023-08-12 MED ORDER — SUCRALFATE 1 G PO TABS
1.0000 g | ORAL_TABLET | Freq: Three times a day (TID) | ORAL | 0 refills | Status: AC
  Filled 2023-08-12: qty 21, 7d supply, fill #0

## 2023-08-12 MED ORDER — SODIUM CHLORIDE 0.9 % IV BOLUS
1000.0000 mL | Freq: Once | INTRAVENOUS | Status: AC
  Administered 2023-08-12: 1000 mL via INTRAVENOUS

## 2023-08-12 MED ORDER — PANTOPRAZOLE SODIUM 40 MG IV SOLR
40.0000 mg | Freq: Once | INTRAVENOUS | Status: AC
  Administered 2023-08-12: 40 mg via INTRAVENOUS
  Filled 2023-08-12: qty 10

## 2023-08-12 NOTE — ED Provider Notes (Signed)
 [facility] Provider Note  CSN: [id number] Arrival date & time: 08/12/23 0865  Chief Complaint(s) Abdominal Pain  HPI Franchelle Foskett is a 53 y.o. female with past medical history as below, significant for endometrial cyst, GERD, hypertension, right thalamic stroke who presents to the ED with complaint of nausea, abdominal pain  Symptom onset over the past few days after recently increasing her Zepbound dosage.  She has been having epigastric pain, cramping, radiation to her back, nausea and vomiting, diarrhea.  Difficulty tolerating p.o. intake as it provokes her symptoms.  Similar symptoms in the past when she was started on Mounjaro.  No fever, chills, no recent travel or sick contacts.  Recently ate a seafood restaurant (fried fish) which she feels like provoked worsening vomiting worsening symptoms.  Past Medical History Past Medical History:  Diagnosis Date   Acute cerebral infarction (HCC) 09/24/2022   Allergic rhinitis    Anemia    Asthma    Back pain    Blood transfusion without reported diagnosis    Cancer (HCC)    Constipation    Endometriosis    GERD (gastroesophageal reflux disease)    History of kidney stones    History of melanoma    Hypertension    Interstitial cystitis    Lower extremity edema    PONV (postoperative nausea and vomiting)    Sleep apnea    Stroke [facility])    Thalamic stroke (HCC) 11/13/2022   09/2022 MRI 1. 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 2. Minimal small-vessel change of the cerebral hemispheric white matter, progressive since 2010. 3. Small hyperdensity seen in the right centrum semiovale region by   Patient Active Problem List   Diagnosis Date Noted   Foot pain, left 12/31/2022   H/O major abdominal surgery 11/14/2022   DJD (degenerative joint disease) of cervical spine 11/13/2022   Cholelithiasis 11/13/2022    Gastroesophageal reflux disease without esophagitis 11/13/2022   Other specified abnormal findings of blood chemistry 11/13/2022   History of ischemic vertebrobasilar artery thalamic stroke 11/13/2022   Vertigo 11/13/2022   Hyperlipidemia 11/13/2022   Low back pain 10/09/2022   Pain in joint of right ankle 10/09/2022   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 Authorizing Provider  amLODipine (NORVASC) 10 MG tablet Take 1 tablet (10 mg total) by mouth daily. 11/13/22   Lula Olszewski, MD  aspirin EC 81 MG tablet Take 1 tablet (81 mg total) by mouth daily. Swallow whole. 09/26/22   Doran Stabler, DO  atorvastatin (LIPITOR) 40 MG tablet TAKE 1 TABLET DAILY 12/12/22   Lula Olszewski, MD  Biotin 78469 MCG TABS Take 1 capsule by mouth daily.    [provider]  diclofenac (VOLTAREN) 75 MG EC tablet Take 75 mg by mouth daily.    [provider]  EPINEPHrine 0.3 mg/0.3 mL IJ SOAJ injection Use contents of one pen to inject into the muslce once as needed for life threatening reaction. May  repeat second time after first seeking emergency medical attention. for 30 days 05/16/22   [provider]  hydrochlorothiazide (HYDRODIURIL) 25 MG tablet Take 0.5 tablets (12.5 mg total) by mouth daily. 11/13/22   Lula Olszewski, MD  hydrOXYzine (ATARAX/VISTARIL) 10 MG tablet Take 10 mg by mouth continuous as needed for itching or anxiety (Use as needed for flare). 07/02/20   [provider]  linaclotide Karlene Einstein) 290 MCG CAPS capsule Take 1 capsule (290 mcg total)  by mouth daily before breakfast. 05/30/21   Helane Rima, DO  meclizine (ANTIVERT) 25 MG tablet Take 1 tablet (25 mg total) by mouth 3 (three) times daily as needed for dizziness. 11/13/22   Lula Olszewski, MD  Meth-Hyo-M Salley Hews Phos-Ph Sal (URIBEL) 118 MG CAPS Take 118 mg by mouth continuous as needed (Only as needed for flare).    [provider]  Misc. Devices MISC Initiate CPAP therapy at 10 cm. water pressure with EPR 3.  CPAP machine, mask and supplies for OSA.  F&P Eson II nasal mask or patient preference.  Send to [place]. 03/28/19   [provider]  nitrofurantoin (MACRODANTIN) 100 MG capsule Take 100 mg by mouth 4 (four) times daily. As needed.    [provider]  ondansetron (ZOFRAN) 4 MG tablet Take 1 tablet (4 mg total) by mouth every 8 (eight) hours as needed for nausea or vomiting. 11/13/22   Lula Olszewski, MD  OVER THE COUNTER MEDICATION daily. [employer] optician, Historical, MD  oxybutynin (DITROPAN) 5 MG tablet Take 5 mg by mouth daily as needed for bladder spasms.    [provider]  pantoprazole (PROTONIX) 40 MG tablet Take 40 mg by mouth daily.    [provider]  tirzepatide Reginia Forts) 2.5 MG/0.5ML Pen  09/17/22   [provider]  tirzepatide Reginia Forts) 5 MG/0.5ML Pen  09/17/22   [provider]  tiZANidine (ZANAFLEX) 4 MG tablet Take 4 mg by mouth every 6 (six) hours as needed for muscle spasms.    [provider]  UNABLE TO FIND in the morning and at bedtime. Omega 7 complete.    [provider]  valsartan (DIOVAN) 160 MG tablet Take by mouth daily. 08/28/22   [provider]  VITAMIN D PO Take by mouth daily. 5000 units .    [provider]                                                                                                                                    Past Surgical History Past Surgical History:  Procedure Laterality Date    ABDOMINAL HYSTERECTOMY     APPENDECTOMY     ARTHROSCOPIC REPAIR ACL     COLON RESECTION     COLON SURGERY     EXCISION MELANOMA WITH SENTINEL LYMPH NODE BIOPSY Right 12/15/2016   Procedure: WIDE EXCISION RIGHT WRIST  MELANOMA  WITH RIGHT SENTINEL NODE MAPPING;  Surgeon: Harriette Bouillon, MD;  Location: [facility];  Service: General;  Laterality: Right;   IRRIGATION AND DEBRIDEMENT ABSCESS Right 12/28/2016   Procedure: IRRIGATION AND DEBRIDEMENT AXILLARY ABSCESS;  Surgeon: Manus Rudd, MD;  Location: [facility];  Service: General;  Laterality: Right;   MELANOMA EXCISION     PELVIC LAPAROSCOPY     Family History Family History  Problem Relation Age of Onset   Cancer Mother    Stroke Mother    Lung cancer Mother 7       deceased 66   Hypertension Mother    Colon cancer Mother        dx 69s   Lung cancer Father 76       deceased 73   Heart disease Father    Hypertension Father    Cancer Father    Lung cancer Maternal Uncle    Lung cancer Maternal Uncle    Pancreatic cancer Maternal Uncle    Breast cancer Paternal Aunt        dx 18s; currently 80s   Breast cancer Paternal Grandmother 26       deceased 90s   Heart disease Paternal Grandmother    Melanoma Cousin        deceased 68    Social History Social History   Tobacco Use   Smoking status: Never   Smokeless tobacco: Never  Vaping Use   Vaping status: Never Used  Substance Use Topics   Alcohol use: No   Drug use: No   Allergies Bacitracin-polymyxin b, Vancomycin, Lisinopril, Wound dressing adhesive, and Phentermine hcl  Review of Systems A thorough review of systems was obtained and all systems are negative except as noted in the HPI and PMH.   Physical Exam Vital Signs  I have reviewed the triage vital signs BP 134/74   Pulse 77   Temp 98.8 F (37.1 C) (Oral)   Resp 18   Ht 5\' 4"  (1.626 m)   Wt 97.1 kg   SpO2 97%   BMI 36.73 kg/m  Physical Exam Vitals and nursing note reviewed.   Constitutional:      General: She is not in acute distress.    Appearance: Normal appearance.  HENT:     Head: Normocephalic and atraumatic.     Right Ear: External ear normal.     Left Ear: External ear normal.     Nose: Nose normal.     Mouth/Throat:     Mouth: Mucous membranes are moist.  Eyes:     General: No scleral icterus.       Right eye: No discharge.        Left eye: No discharge.  Cardiovascular:     Rate and Rhythm: Normal rate and regular rhythm.     Pulses: Normal pulses.     Heart sounds: Normal heart sounds.  Pulmonary:     Effort: Pulmonary effort is normal. No respiratory distress.     Breath sounds: Normal breath sounds. No stridor.  Abdominal:     General: Abdomen is flat. There is no distension.     Palpations: Abdomen is soft.     Tenderness: There is abdominal tenderness in the epigastric area.  Musculoskeletal:     Cervical back: No rigidity.     Right lower leg: No edema.     Left lower leg: No edema.  Skin:    General: Skin is warm and dry.     Capillary  Refill: Capillary refill takes less than 2 seconds.  Neurological:     Mental Status: She is alert.  Psychiatric:        Mood and Affect: Mood normal.        Behavior: Behavior normal. Behavior is cooperative.     ED Results and Treatments Labs (all labs ordered are listed, but only abnormal results are displayed) Labs Reviewed  COMPREHENSIVE METABOLIC PANEL - Abnormal; Notable for the following components:      Result Value   Glucose, Bld 106 (*)    All other components within normal limits  CBC WITH DIFFERENTIAL/PLATELET  LIPASE, BLOOD                                                                                                                          Radiology CT ABDOMEN PELVIS W CONTRAST Result Date: 08/12/2023 CLINICAL DATA:  Epigastric pain. EXAM: CT ABDOMEN AND PELVIS WITH CONTRAST TECHNIQUE: Multidetector CT imaging of the abdomen and pelvis was performed using the standard  protocol following bolus administration of intravenous contrast. RADIATION DOSE REDUCTION: This exam was performed according to the departmental dose-optimization program which includes automated exposure control, adjustment of the mA and/or kV according to patient size and/or use of iterative reconstruction technique. CONTRAST:  OMNIPAQUE IOHEXOL 300 MG/ML  SOLN COMPARISON:  CT Angiography chest, abdomen and pelvis from 12/23/2021. FINDINGS: Lower chest: The lung bases are clear. No pleural effusion. The heart is normal in size. No pericardial effusion. Hepatobiliary: The liver is normal in size. Non-cirrhotic configuration. No suspicious mass. These is mild diffuse hepatic steatosis. No intrahepatic or extrahepatic bile duct dilation. No calcified gallstones. Normal gallbladder wall thickness. No pericholecystic inflammatory changes. Pancreas: Unremarkable. No pancreatic ductal dilatation or surrounding inflammatory changes. Spleen: Within normal limits. No focal lesion. Adrenals/Urinary Tract: Adrenal glands are unremarkable. There is focal scarring in the left kidney lower pole. There are several scattered simple cysts throughout bilateral kidneys with largest in the left kidney lower pole measuring up to 2.1 x 2.4 cm. There are at least 5 nonobstructing calculi in the left kidney with largest measuring up to 5 mm. There are at least 2 nonobstructing calculi in the right kidney measuring up to 1-2 mm in size. No ureterolithiasis or obstructive uropathy on either side. Urinary bladder is under distended, precluding optimal assessment. However, no large mass or stones identified. No perivesical fat stranding. Stomach/Bowel: No disproportionate dilation of the small or large bowel loops. No evidence of abnormal bowel wall thickening or inflammatory changes. The appendix was not visualized; however there is no acute inflammatory process in the right lower quadrant. Colocolonic anastomosis noted in the  posterior pelvis and in the distal rectum. Vascular/Lymphatic: No ascites or pneumoperitoneum. No abdominal or pelvic lymphadenopathy, by size criteria. No aneurysmal dilation of the major abdominal arteries. There are mild peripheral atherosclerotic vascular calcifications of the aorta and its major branches. Reproductive: The uterus is surgically absent. No large adnexal mass. Other: There is  a tiny fat containing umbilical hernia. The soft tissues and abdominal wall are otherwise unremarkable. Musculoskeletal: No suspicious osseous lesions. There are mild multilevel degenerative changes in the visualized spine. IMPRESSION: 1. No acute inflammatory process identified within the abdomen or pelvis. 2. Multiple other nonacute observations, as described above. Aortic Atherosclerosis (ICD10-I70.0). Electronically Signed   By: Jules Schick M.D.   On: 08/12/2023 14:27    Pertinent labs & imaging results that were available during my care of the patient were reviewed by me and considered in my medical decision making (see MDM for details).  Medications Ordered in ED Medications  morphine (PF) 4 MG/ML injection 4 mg (4 mg Intravenous Patient Refused/Not Given 08/12/23 1033)  pantoprazole (PROTONIX) injection 40 mg (40 mg Intravenous Given 08/12/23 1031)  sodium chloride 0.9 % bolus 1,000 mL (0 mLs Intravenous Stopped 08/12/23 1148)  ondansetron (ZOFRAN) injection 4 mg (4 mg Intravenous Given 08/12/23 1032)  iohexol (OMNIPAQUE) 300 MG/ML solution 100 mL (100 mLs Intravenous Contrast Given 08/12/23 1154)  sodium chloride 0.9 % bolus 1,000 mL (1,000 mLs Intravenous New Bag/Given 08/12/23 1430)                                                                                                                                     Procedures Procedures  (including critical care time)  Medical Decision Making / ED Course    Medical Decision Making:    Deshea Pooley is a 53 y.o. female  with past medical  history as below, significant for endometrial cyst, GERD, hypertension, right thalamic stroke who presents to the ED with complaint of nausea, abdominal pain. The complaint involves an extensive differential diagnosis and also carries with it a high risk of complications and morbidity.  Serious etiology was considered. Ddx includes but is not limited to: Differential diagnosis includes but is not exclusive to acute cholecystitis, intrathoracic causes for epigastric abdominal pain, gastritis, duodenitis, pancreatitis, small bowel or large bowel obstruction, abdominal aortic aneurysm, hernia, gastritis, etc.   Complete initial physical exam performed, notably the patient was in no distress, abd not peritoneal.    Reviewed and confirmed nursing documentation for past medical history, family history, social history.  Vital signs reviewed.      Clinical Course as of 08/12/23 1502  Wed Aug 12, 2023  1421 Feeling much better [SG]    Clinical Course User Index [SG] Sloan Leiter, DO    Brief summary: 53 year old female history as above here with epigastric abdominal pain, nausea vomiting diarrhea She recently increased her Zepbound dosage prior to onset of her symptoms also ate a fried fish meal at a seafood restaurant. Her exam is reassuring, she is HDS, abdomen is nonperitoneal. Screening labs, provide symptomatic relief, will also get CT abdomen pelvis given acute onset of symptoms  Labs reviewed, these are stable, CT imaging reviewed, also stable.  She is feeling much better, she is tolerant p.o.  intake with difficulty.  No ongoing nausea.  Pain has resolved.  Fair symptoms likely medication effect, also possible secondary to recent fried meal at the onset of her symptoms.  Recommend she reduce her Zepbound dosage, follow-up PCP for guidance.  Continue taking Protonix daily, Zofran as needed.  Clear liquid diet.f/u pcp  The patient improved significantly and was discharged in stable condition.  Detailed discussions were had with the patient/guardian regarding current findings, and need for close f/u with PCP or on call doctor. The patient/guardian has been instructed to return immediately if the symptoms worsen in any way for re-evaluation. Patient/guardian verbalized understanding and is in agreement with current care plan. All questions answered prior to discharge.                Additional history obtained: -Additional history obtained from spouse -External records from outside source obtained and reviewed including: Chart review including previous notes, labs, imaging, consultation notes including  Echo 4/24 LVE 60-65% Prior labs, prior imaging, medication   Lab Tests: -I ordered, reviewed, and interpreted labs.   The pertinent results include:   Labs Reviewed  COMPREHENSIVE METABOLIC PANEL - Abnormal; Notable for the following components:      Result Value   Glucose, Bld 106 (*)    All other components within normal limits  CBC WITH DIFFERENTIAL/PLATELET  LIPASE, BLOOD    Notable for labs are stable  EKG   EKG Interpretation Date/Time:    Ventricular Rate:    PR Interval:    QRS Duration:    QT Interval:    QTC Calculation:   R Axis:      Text Interpretation:           Imaging Studies ordered: I ordered imaging studies including CTAP I independently visualized the following imaging with scope of interpretation limited to determining acute life threatening conditions related to emergency care; findings noted above I independently visualized and interpreted imaging. I agree with the radiologist interpretation   Medicines ordered and prescription drug management: Meds ordered this encounter  Medications   pantoprazole (PROTONIX) injection 40 mg   sodium chloride 0.9 % bolus 1,000 mL   ondansetron (ZOFRAN) injection 4 mg   morphine (PF) 4 MG/ML injection 4 mg   iohexol (OMNIPAQUE) 300 MG/ML solution 100 mL   sodium chloride 0.9 % bolus  1,000 mL    -I have reviewed the patients home medicines and have made adjustments as needed   Consultations Obtained: na   Cardiac Monitoring: Continuous pulse oximetry interpreted by myself, 98% on RA.    Social Determinants of Health:  Diagnosis or treatment significantly limited by social determinants of health: obesity   Reevaluation: After the interventions noted above, I reevaluated the patient and found that they have improved  Co morbidities that complicate the patient evaluation  Past Medical History:  Diagnosis Date   Acute cerebral infarction (HCC) 09/24/2022   Allergic rhinitis    Anemia    Asthma    Back pain    Blood transfusion without reported diagnosis    Cancer (HCC)    Constipation    Endometriosis    GERD (gastroesophageal reflux disease)    History of kidney stones    History of melanoma    Hypertension    Interstitial cystitis    Lower extremity edema    PONV (postoperative nausea and vomiting)    Sleep apnea    Stroke [facility])    Thalamic stroke (HCC) 11/13/2022   09/2022 MRI 1.  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 2. Minimal small-vessel change of the cerebral hemispheric white matter, progressive since 2010. 3. Small hyperdensity seen in the right centrum semiovale region by      Dispostion: Disposition decision including need for hospitalization was considered, and patient discharged from emergency department.    Final Clinical Impression(s) / ED Diagnoses Final diagnoses:  Epigastric pain  Nausea and vomiting, unspecified vomiting type        Sloan Leiter, DO 08/12/23 1502

## 2023-08-12 NOTE — ED Notes (Signed)
 Pt ambulating to bathroom.

## 2023-08-12 NOTE — ED Triage Notes (Signed)
 Pt states she has been on a GLP-1 for a few months.  Pt states she has been trying to titrate up her dose on Friday and Saturday and has been having abdominal pain with nausea ever since.  Pt also constipated but had BM this am that was yellow.

## 2023-08-12 NOTE — Discharge Instructions (Addendum)
 Recommend you follow a clear liquid diet over the next few days.  Details of this are outlined in handout.  Recommend you reduce the frequency of your Zepbound dosing.  Please follow-up with your PCP in the next few days for recheck  It was a pleasure caring for you today in the emergency department.  Please return to the emergency department for any worsening or worrisome symptoms.

## 2023-08-13 ENCOUNTER — Other Ambulatory Visit (HOSPITAL_BASED_OUTPATIENT_CLINIC_OR_DEPARTMENT_OTHER): Payer: Self-pay

## 2023-11-13 ENCOUNTER — Telehealth: Payer: Self-pay | Admitting: Neurology

## 2023-11-13 NOTE — Telephone Encounter (Signed)
 Pt call to schedule appt  Appt Scheduled

## 2023-12-09 ENCOUNTER — Telehealth: Payer: Self-pay | Admitting: Neurology

## 2023-12-09 NOTE — Telephone Encounter (Signed)
 request to cancel appointment

## 2023-12-16 ENCOUNTER — Ambulatory Visit: Payer: PRIVATE HEALTH INSURANCE | Admitting: Neurology
